# Patient Record
Sex: Female | Born: 1970 | Race: Black or African American | Hispanic: No | Marital: Single | Smoking: Never smoker
Health system: Southern US, Community
[De-identification: ages and names within clinical notes are randomized; demographics above are authoritative.]

## PROBLEM LIST (undated history)

## (undated) DIAGNOSIS — J45909 Unspecified asthma, uncomplicated: Secondary | ICD-10-CM

## (undated) DIAGNOSIS — F419 Anxiety disorder, unspecified: Secondary | ICD-10-CM

## (undated) DIAGNOSIS — R7303 Prediabetes: Secondary | ICD-10-CM

## (undated) DIAGNOSIS — I10 Essential (primary) hypertension: Secondary | ICD-10-CM

## (undated) HISTORY — PX: NO PAST SURGERIES: SHX2092

---

## 2000-01-06 ENCOUNTER — Other Ambulatory Visit: Admission: RE | Admit: 2000-01-06 | Discharge: 2000-01-06 | Payer: Self-pay | Admitting: Gynecology

## 2000-05-02 ENCOUNTER — Encounter: Payer: Self-pay | Admitting: Emergency Medicine

## 2000-05-02 ENCOUNTER — Emergency Department (HOSPITAL_COMMUNITY): Admission: EM | Admit: 2000-05-02 | Discharge: 2000-05-02 | Payer: Self-pay | Admitting: Emergency Medicine

## 2001-02-28 ENCOUNTER — Other Ambulatory Visit: Admission: RE | Admit: 2001-02-28 | Discharge: 2001-02-28 | Payer: Self-pay | Admitting: Gynecology

## 2001-09-16 ENCOUNTER — Emergency Department (HOSPITAL_COMMUNITY): Admission: EM | Admit: 2001-09-16 | Discharge: 2001-09-16 | Payer: Self-pay | Admitting: *Deleted

## 2001-09-19 ENCOUNTER — Encounter: Payer: Self-pay | Admitting: *Deleted

## 2001-09-19 ENCOUNTER — Inpatient Hospital Stay (HOSPITAL_COMMUNITY): Admission: AD | Admit: 2001-09-19 | Discharge: 2001-09-19 | Payer: Self-pay | Admitting: *Deleted

## 2002-03-02 ENCOUNTER — Other Ambulatory Visit: Admission: RE | Admit: 2002-03-02 | Discharge: 2002-03-02 | Payer: Self-pay | Admitting: Gynecology

## 2003-04-03 ENCOUNTER — Emergency Department (HOSPITAL_COMMUNITY): Admission: EM | Admit: 2003-04-03 | Discharge: 2003-04-03 | Payer: Self-pay | Admitting: Emergency Medicine

## 2003-04-03 ENCOUNTER — Other Ambulatory Visit: Admission: RE | Admit: 2003-04-03 | Discharge: 2003-04-03 | Payer: Self-pay | Admitting: Gynecology

## 2004-04-09 ENCOUNTER — Other Ambulatory Visit: Admission: RE | Admit: 2004-04-09 | Discharge: 2004-04-09 | Payer: Self-pay | Admitting: Gynecology

## 2005-01-14 ENCOUNTER — Emergency Department: Payer: Self-pay | Admitting: Emergency Medicine

## 2005-04-22 ENCOUNTER — Ambulatory Visit: Payer: Self-pay

## 2006-08-05 ENCOUNTER — Emergency Department: Payer: Self-pay | Admitting: Emergency Medicine

## 2006-12-21 ENCOUNTER — Ambulatory Visit: Payer: Self-pay

## 2007-04-01 ENCOUNTER — Ambulatory Visit: Payer: Self-pay | Admitting: Internal Medicine

## 2008-04-24 ENCOUNTER — Ambulatory Visit: Payer: Self-pay

## 2009-07-23 ENCOUNTER — Ambulatory Visit: Payer: Self-pay

## 2009-08-05 ENCOUNTER — Ambulatory Visit: Payer: Self-pay

## 2010-06-06 ENCOUNTER — Emergency Department: Payer: Self-pay | Admitting: Emergency Medicine

## 2010-08-14 ENCOUNTER — Ambulatory Visit: Payer: Self-pay

## 2011-09-24 ENCOUNTER — Ambulatory Visit: Payer: Self-pay

## 2012-03-12 ENCOUNTER — Ambulatory Visit: Payer: Self-pay | Admitting: Family Medicine

## 2012-11-14 ENCOUNTER — Ambulatory Visit: Payer: Self-pay

## 2013-12-21 ENCOUNTER — Ambulatory Visit: Payer: Self-pay

## 2014-10-11 ENCOUNTER — Encounter: Payer: Self-pay | Admitting: Emergency Medicine

## 2014-10-11 ENCOUNTER — Ambulatory Visit
Admission: EM | Admit: 2014-10-11 | Discharge: 2014-10-11 | Disposition: A | Discharge status: Home / Self Care | Payer: BC Managed Care – PPO | Attending: Family Medicine | Admitting: Family Medicine

## 2014-10-11 DIAGNOSIS — J0100 Acute maxillary sinusitis, unspecified: Secondary | ICD-10-CM | POA: Diagnosis not present

## 2014-10-11 HISTORY — DX: Unspecified asthma, uncomplicated: J45.909

## 2014-10-11 MED ORDER — AMOXICILLIN-POT CLAVULANATE 875-125 MG PO TABS: 1.0000 | ORAL_TABLET | Freq: Two times a day (BID) | ORAL | Status: DC

## 2014-10-11 NOTE — Discharge Instructions (Signed)
Take the antibiotic as prescribed.  Follow up as needed.  Please be sure to take your Advair twice daily.  Sinusitis Sinusitis is redness, soreness, and inflammation of the paranasal sinuses. Paranasal sinuses are air pockets within the bones of your face (beneath the eyes, the middle of the forehead, or above the eyes). In healthy paranasal sinuses, mucus is able to drain out, and air is able to circulate through them by way of your nose. However, when your paranasal sinuses are inflamed, mucus and air can become trapped. This can allow bacteria and other germs to grow and cause infection. Sinusitis can develop quickly and last only a short time (acute) or continue over a long period (chronic). Sinusitis that lasts for more than 12 weeks is considered chronic.  CAUSES  Causes of sinusitis include:  Allergies.  Structural abnormalities, such as displacement of the cartilage that separates your nostrils (deviated septum), which can decrease the air flow through your nose and sinuses and affect sinus drainage.  Functional abnormalities, such as when the small hairs (cilia) that line your sinuses and help remove mucus do not work properly or are not present. SIGNS AND SYMPTOMS  Symptoms of acute and chronic sinusitis are the same. The primary symptoms are pain and pressure around the affected sinuses. Other symptoms include:  Upper toothache.  Earache.  Headache.  Bad breath.  Decreased sense of smell and taste.  A cough, which worsens when you are lying flat.  Fatigue.  Fever.  Thick drainage from your nose, which often is green and may contain pus (purulent).  Swelling and warmth over the affected sinuses. DIAGNOSIS  Your health care provider will perform a physical exam. During the exam, your health care provider may:  Look in your nose for signs of abnormal growths in your nostrils (nasal polyps).  Tap over the affected sinus to check for signs of infection.  View the  inside of your sinuses (endoscopy) using an imaging device that has a light attached (endoscope). If your health care provider suspects that you have chronic sinusitis, one or more of the following tests may be recommended:  Allergy tests.  Nasal culture. A sample of mucus is taken from your nose, sent to a lab, and screened for bacteria.  Nasal cytology. A sample of mucus is taken from your nose and examined by your health care provider to determine if your sinusitis is related to an allergy. TREATMENT  Most cases of acute sinusitis are related to a viral infection and will resolve on their own within 10 days. Sometimes medicines are prescribed to help relieve symptoms (pain medicine, decongestants, nasal steroid sprays, or saline sprays).  However, for sinusitis related to a bacterial infection, your health care provider will prescribe antibiotic medicines. These are medicines that will help kill the bacteria causing the infection.  Rarely, sinusitis is caused by a fungal infection. In theses cases, your health care provider will prescribe antifungal medicine. For some cases of chronic sinusitis, surgery is needed. Generally, these are cases in which sinusitis recurs more than 3 times per year, despite other treatments. HOME CARE INSTRUCTIONS   Drink plenty of water. Water helps thin the mucus so your sinuses can drain more easily.  Use a humidifier.  Inhale steam 3 to 4 times a day (for example, sit in the bathroom with the shower running).  Apply a warm, moist washcloth to your face 3 to 4 times a day, or as directed by your health care provider.  Use saline  nasal sprays to help moisten and clean your sinuses.  Take medicines only as directed by your health care provider.  If you were prescribed either an antibiotic or antifungal medicine, finish it all even if you start to feel better. SEEK IMMEDIATE MEDICAL CARE IF:  You have increasing pain or severe headaches.  You have  nausea, vomiting, or drowsiness.  You have swelling around your face.  You have vision problems.  You have a stiff neck.  You have difficulty breathing. MAKE SURE YOU:   Understand these instructions.  Will watch your condition.  Will get help right away if you are not doing well or get worse. Document Released: 12/28/2004 Document Revised: 05/14/2013 Document Reviewed: 01/12/2011 [facility] Patient Information 2015 [place], [place]. This information is not intended to replace advice given to you by your health care provider. Make sure you discuss any questions you have with your health care provider.

## 2014-10-11 NOTE — ED Notes (Signed)
Sinus pressure x 3 days

## 2014-10-11 NOTE — ED Provider Notes (Signed)
CSN: [id number]     Arrival date & time 10/11/14  1745 History   First MD Initiated Contact with Patient 10/11/14 1750     Chief Complaint  Patient presents with  . Facial Pain   (Consider location/radiation/quality/duration/timing/severity/associated sxs/prior Treatment) HPI 44 year old female the Asthma presents with complaints of sinus pressure/pain.  Patient reports that she has been troubled by this for the past few weeks.  It worsened this week. Pain/pressure is located predominantly over the left maxillary sinus.  She denies any associated cough, fever, chills.  She has had some rhinorrhea.  Facial pain is moderate in severity.  No exacerbating or relieving factors. No interventions tried.  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    Past Medical History  Diagnosis Date  . Asthma    Past Surgical History  Procedure Laterality Date  . No past surgeries      Family History  Problem Relation Age of Onset  . Asthma Mother   . Asthma Sister   . Asthma Brother     Social History  Substance Use Topics  . Smoking status: Never Smoker   . Smokeless tobacco: None  . Alcohol Use: Yes   OB History    No data available     Review of Systems  Constitutional: Negative for fever and chills.  HENT: Positive for congestion, ear pain, rhinorrhea and sinus pressure.   Eyes: Negative.   Respiratory: Positive for chest tightness and shortness of breath.   All other systems negative.  Allergies  Review of patient's allergies indicates no known allergies.  Home Medications   Prior to Admission medications   Medication Sig Start Date End Date Taking? Authorizing Provider  amoxicillin-clavulanate (AUGMENTIN) 875-125 MG tablet Take 1 tablet by mouth 2 (two) times daily. 10/11/14   Tommie Sams, DO   Meds Ordered and Administered this Visit  Medications - No data to display  BP 130/87 mmHg  Pulse 61  Temp(Src) 98.2 F (36.8 C) (Tympanic)  Resp 20  Ht   (1.702 m)  Wt 176 lb (79.833 kg)  BMI 27.56 kg/m2  SpO2 99%  LMP 09/27/2014 No data found.  Physical Exam  Constitutional: She is oriented to person, place, and time. She appears well-developed and well-nourished. No distress.  HENT:  Head: Normocephalic and atraumatic.  Mouth/Throat: Oropharynx is clear and moist. No oropharyngeal exudate.  Normal TM's bilaterally.  Turbinates with edema.  Maxillary sinus (left) tender to palpation.  Eyes: Conjunctivae are normal. No scleral icterus.  Neck: Neck supple. No thyromegaly present.  Cardiovascular: Normal rate and regular rhythm.   No murmur heard. Pulmonary/Chest: Effort normal and breath sounds normal. No respiratory distress. She has no wheezes. She has no rales. She exhibits no tenderness.  Abdominal: Soft. She exhibits no distension. There is no tenderness. There is no rebound and no guarding.  Lymphadenopathy:    She has no cervical adenopathy.  Neurological: She is alert and oriented to person, place, and time.  Skin: Skin is warm and dry. No rash noted.  Psychiatric: She has a normal mood and affect.  Vitals reviewed.  ED Course  Procedures (including critical care time)  Labs Review Labs Reviewed - No data to display  Imaging Review No results found.  MDM   1. Acute maxillary sinusitis, recurrence not specified    44 year old female with clinical picture consistent with sinusitis. Treating with Augmentin and advised close follow up with PCP. Patient to  be discharged home in stable condition. Advised compliance with home Advair and PRN Albuterol. No evidence of current asthma exacerbation.    Tommie Sams, DO 10/11/14 1946

## 2014-11-11 ENCOUNTER — Other Ambulatory Visit: Payer: Self-pay | Admitting: Obstetrics and Gynecology

## 2014-11-11 DIAGNOSIS — Z1231 Encounter for screening mammogram for malignant neoplasm of breast: Secondary | ICD-10-CM

## 2015-01-02 ENCOUNTER — Ambulatory Visit: Payer: BC Managed Care – PPO | Attending: Obstetrics and Gynecology

## 2015-09-10 ENCOUNTER — Encounter: Payer: Self-pay | Admitting: Emergency Medicine

## 2015-09-10 ENCOUNTER — Emergency Department
Admission: EM | Admit: 2015-09-10 | Discharge: 2015-09-10 | Disposition: A | Discharge status: Home / Self Care | Payer: BC Managed Care – PPO | Attending: Emergency Medicine | Admitting: Emergency Medicine

## 2015-09-10 ENCOUNTER — Emergency Department: Payer: BC Managed Care – PPO

## 2015-09-10 DIAGNOSIS — J45909 Unspecified asthma, uncomplicated: Secondary | ICD-10-CM | POA: Diagnosis not present

## 2015-09-10 DIAGNOSIS — R42 Dizziness and giddiness: Secondary | ICD-10-CM

## 2015-09-10 DIAGNOSIS — I159 Secondary hypertension, unspecified: Secondary | ICD-10-CM | POA: Insufficient documentation

## 2015-09-10 DIAGNOSIS — R51 Headache: Secondary | ICD-10-CM | POA: Diagnosis present

## 2015-09-10 DIAGNOSIS — F419 Anxiety disorder, unspecified: Secondary | ICD-10-CM | POA: Diagnosis not present

## 2015-09-10 DIAGNOSIS — R519 Headache, unspecified: Secondary | ICD-10-CM

## 2015-09-10 DIAGNOSIS — F439 Reaction to severe stress, unspecified: Secondary | ICD-10-CM | POA: Diagnosis not present

## 2015-09-10 DIAGNOSIS — R079 Chest pain, unspecified: Secondary | ICD-10-CM

## 2015-09-10 LAB — CBC
HCT: 36.3 % (ref 35.0–47.0)
HEMOGLOBIN: 12 g/dL (ref 12.0–16.0)
MCH: 27.8 pg (ref 26.0–34.0)
MCHC: 33.2 g/dL (ref 32.0–36.0)
MCV: 83.9 fL (ref 80.0–100.0)
Platelets: 283 10*3/uL (ref 150–440)
RBC: 4.32 MIL/uL (ref 3.80–5.20)
RDW: 13.3 % (ref 11.5–14.5)
WBC: 11.8 10*3/uL — ABNORMAL HIGH (ref 3.6–11.0)

## 2015-09-10 LAB — BASIC METABOLIC PANEL
ANION GAP: 3 — AB (ref 5–15)
BUN: 11 mg/dL (ref 6–20)
CALCIUM: 8.8 mg/dL — AB (ref 8.9–10.3)
CHLORIDE: 106 mmol/L (ref 101–111)
CO2: 27 mmol/L (ref 22–32)
Creatinine, Ser: 0.68 mg/dL (ref 0.44–1.00)
GFR calc non Af Amer: >60 mL/min (ref >60)
Glucose, Bld: 92 mg/dL (ref 65–99)
Potassium: 3.6 mmol/L (ref 3.5–5.1)
Sodium: 136 mmol/L (ref 135–145)

## 2015-09-10 LAB — TROPONIN I

## 2015-09-10 MED ORDER — IBUPROFEN 800 MG PO TABS: 800.0000 mg | ORAL_TABLET | Freq: Three times a day (TID) | ORAL | 0 refills | Status: DC | PRN

## 2015-09-10 NOTE — ED Notes (Signed)
Pt reports that she went to urgent care for headaches and elevated BP - she was also experiencing chest tightness without relief from inhaler - urgent care sent her to the ed for eval - pt reports shortness of breath but respirations are even and unlabored with lungs sound clear in all lobes - skin warm/dry - color race appropriate - pt denies chest pain just states it "feels like I need my inhaler" - pt is concerned about headaches and dizziness for the last 3-4 days

## 2015-09-10 NOTE — Discharge Instructions (Signed)
Please drink plenty of fluids, eat small regular meals struck the day, and get plenty of rest. You may take Motrin for headache.  Please make an appointment with Dr. Marcello FennelHande to have your blood pressure reevaluated, for reevaluation of her other symptoms.  Return to the emergency department if he develops severe pain, chest pain, lightheadedness or fainting, or any other symptoms concerning to you.

## 2015-09-10 NOTE — ED Provider Notes (Signed)
[facility] Emergency Department Provider Note  ____________________________________________  Time seen: Approximately 8:32 PM  I have reviewed the triage vital signs and the nursing notes.   HISTORY  Chief Complaint Chest Pain    HPI Ana Atkins is a 45 y.o. female with a history of asthma presenting for headache, dizziness, chest pain, and stress.The patient reports that for the last week, she has had a frontal headache associated with some mild dizziness, "but I never thought I was, pass out." She treated her pain with sinus medication, and PMS medication. In addition, she had some mild chest "tightness," which improved when she "calm down." Her headache also improves if she is able to stay calm. She denies any fever, trauma, visual or speech changes, numbness tingling or weakness. No nausea vomiting or diarrhea. At urgent care, the patient was found to be hypertensive.   Past Medical History:  Diagnosis Date  . Asthma     There are no active problems to display for this patient.   Past Surgical History:  Procedure Laterality Date  . NO PAST SURGERIES      Current Outpatient Rx  . Order #: [id number] Class: Normal  . Order #: [id number] Class: Print    Allergies Review of patient's allergies indicates no known allergies.  Family History  Problem Relation Age of Onset  . Asthma Mother   . Asthma Sister   . Asthma Brother     Social History Social History  Substance Use Topics  . Smoking status: Never Smoker  . Smokeless tobacco: Never Used  . Alcohol use Yes    Review of Systems Constitutional: No fever/chills.Positive dizziness. No lightheadedness or syncope. Eyes: No visual changes. No blurred or double vision. ENT: No sore throat. No congestion or rhinorrhea. Cardiovascular: Positive chest pain. Denies palpitations. Respiratory: Denies shortness of breath.  No cough. Gastrointestinal: No abdominal pain.  No nausea, no  vomiting.  No diarrhea.  No constipation. Genitourinary: Negative for dysuria. Musculoskeletal: Negative for back pain. Skin: Negative for rash. Neurological: Positive for headaches. No focal numbness, tingling or weakness.  Psychiatric:Positive anxiety from situational stress.  10-point ROS otherwise negative.  ____________________________________________   PHYSICAL EXAM:  VITAL SIGNS: ED Triage Vitals [09/10/15 1652]  Enc Vitals Group     BP (!) 154/93     Pulse Rate 67     Resp 18     Temp 98.4 F (36.9 C)     Temp Source Oral     SpO2 100 %     Weight 176 lb (79.8 kg)     Height 5\' 7"  (1.702 m)     Head Circumference      Peak Flow      Pain Score 5     Pain Loc      Pain Edu?      Excl. in GC?     Constitutional: Alert and oriented. Well appearing and in no acute distress. Answers questions appropriately.Patient is comfortable appearing and smiling. Eyes: Conjunctivae are normal.  . PERRLA. No scleral icterus. Head: Atraumatic. Nose: No congestion/rhinnorhea. Mouth/Throat: Mucous membranes are moist.  Neck: No stridor.  Supple.  No JVD. No meningismus. Cardiovascular: Normal rate, regular rhythm. No murmurs, rubs or gallops.  Respiratory: Normal respiratory effort.  No accessory muscle use or retractions. Lungs CTAB.  No wheezes, rales or ronchi. Gastrointestinal: Soft, nontender and nondistended.  No guarding or rebound.  No peritoneal signs. Musculoskeletal: No LE edema. No ttp in the calves or palpable cords.  Negative Homan's sign. Neurologic:  A&Ox3.  Speech is clear.  Face and smile are symmetric.  EOMI.  PERRLA. No nystagmus horizontally or vertically. Moves all extremities well. Normal gait without ataxia. Skin:  Skin is warm, dry and intact. No rash noted. Psychiatric: Mood and affect are normal. Speech and behavior are normal.  Normal judgement.  ____________________________________________   LABS (all labs ordered are listed, but only abnormal  results are displayed)  Labs Reviewed  BASIC METABOLIC PANEL - Abnormal; Notable for the following:       Result Value   Calcium 8.8 (*)    Anion gap 3 (*)    All other components within normal limits  CBC - Abnormal; Notable for the following:    WBC 11.8 (*)    All other components within normal limits  TROPONIN I   ____________________________________________  EKG  ED ECG REPORT I, Rockne Menghini, the attending physician, personally viewed and interpreted this ECG.   Date: 09/10/2015  EKG Time: 1652  Rate: 68  Rhythm: normal sinus rhythm  Axis: Normal  Intervals:none  ST&T Change: Nonspecific T-wave inversions in V1. No ischemic changes. No evidence of hypertrophy.  ____________________________________________  RADIOLOGY  Dg Chest 2 View  Result Date: 09/10/2015 CLINICAL DATA:  Chest tightness EXAM: CHEST  2 VIEW COMPARISON:  06/06/2010 chest radiograph. FINDINGS: Stable cardiomediastinal silhouette with normal heart size. No pneumothorax. No pleural effusion. Lungs appear clear, with no acute consolidative airspace disease and no pulmonary edema. IMPRESSION: No active cardiopulmonary disease. Electronically Signed   By: Delbert Phenix M.D.   On: 09/10/2015 18:47    ____________________________________________   PROCEDURES  Procedure(s) performed: None  Procedures  Critical Care performed: No ____________________________________________   INITIAL IMPRESSION / ASSESSMENT AND PLAN / ED COURSE  Pertinent labs & imaging results that were available during my care of the patient were reviewed by me and considered in my medical decision making (see chart for details).  45 y.o. female presenting with several days of mild dizziness with headache, chest pain and situational stress. Overall, the patient is well-appearing. Her vital signs do show some hypertension and this runs in her family that she has not been diagnosed with this. I have encouraged her to follow  up with her primary care physician to have her blood pressure reevaluated. At this time her blood pressure is 157/99. It is unlikely that the patient's chest pain is related to ACS or MI, no do not see any evidence that would be suggestive of PE. The patient is no longer having any symptoms at this time, including headache. I do not think she has an intracranial mass, or meningitis. At this time, the patient is safe for discharge. I have discussed follow-up and return precautions with the patient.  ____________________________________________  FINAL CLINICAL IMPRESSION(S) / ED DIAGNOSES  Final diagnoses:  Secondary hypertension, unspecified  Acute nonintractable headache, unspecified headache type  Chest pain, unspecified chest pain type  Stress  Dizziness    Clinical Course      NEW MEDICATIONS STARTED DURING THIS VISIT:  New Prescriptions   IBUPROFEN (ADVIL,MOTRIN) 800 MG TABLET    Take 1 tablet (800 mg total) by mouth every 8 (eight) hours as needed for headache (with food).      Rockne Menghini, MD 09/10/15 2037

## 2015-09-10 NOTE — ED Triage Notes (Signed)
Upper chest wall tightness , non radiating , with lightheadedness x3 days, hx of asthma,

## 2016-12-30 ENCOUNTER — Other Ambulatory Visit: Payer: Self-pay | Admitting: Obstetrics and Gynecology

## 2016-12-30 DIAGNOSIS — Z1231 Encounter for screening mammogram for malignant neoplasm of breast: Secondary | ICD-10-CM

## 2017-01-26 ENCOUNTER — Ambulatory Visit
Admission: RE | Admit: 2017-01-26 | Discharge: 2017-01-26 | Disposition: A | Discharge status: Home / Self Care | Payer: BC Managed Care – PPO | Source: Ambulatory Visit | Attending: Obstetrics and Gynecology | Admitting: Obstetrics and Gynecology

## 2017-01-26 DIAGNOSIS — Z1231 Encounter for screening mammogram for malignant neoplasm of breast: Secondary | ICD-10-CM | POA: Insufficient documentation

## 2017-01-31 ENCOUNTER — Other Ambulatory Visit: Payer: Self-pay | Admitting: Obstetrics and Gynecology

## 2017-01-31 DIAGNOSIS — R928 Other abnormal and inconclusive findings on diagnostic imaging of breast: Secondary | ICD-10-CM

## 2017-01-31 DIAGNOSIS — N6489 Other specified disorders of breast: Secondary | ICD-10-CM

## 2017-02-09 ENCOUNTER — Ambulatory Visit
Admission: RE | Admit: 2017-02-09 | Discharge: 2017-02-09 | Disposition: A | Discharge status: Home / Self Care | Payer: BC Managed Care – PPO | Source: Ambulatory Visit | Attending: Obstetrics and Gynecology | Admitting: Obstetrics and Gynecology

## 2017-02-09 DIAGNOSIS — N6489 Other specified disorders of breast: Secondary | ICD-10-CM

## 2017-02-09 DIAGNOSIS — R928 Other abnormal and inconclusive findings on diagnostic imaging of breast: Secondary | ICD-10-CM

## 2017-04-07 ENCOUNTER — Emergency Department: Payer: BC Managed Care – PPO

## 2017-04-07 ENCOUNTER — Emergency Department
Admission: EM | Admit: 2017-04-07 | Discharge: 2017-04-08 | Disposition: A | Discharge status: Home / Self Care | Payer: BC Managed Care – PPO | Attending: Emergency Medicine | Admitting: Emergency Medicine

## 2017-04-07 ENCOUNTER — Other Ambulatory Visit: Payer: Self-pay

## 2017-04-07 DIAGNOSIS — J45909 Unspecified asthma, uncomplicated: Secondary | ICD-10-CM | POA: Diagnosis not present

## 2017-04-07 DIAGNOSIS — F329 Major depressive disorder, single episode, unspecified: Secondary | ICD-10-CM

## 2017-04-07 DIAGNOSIS — F4323 Adjustment disorder with mixed anxiety and depressed mood: Secondary | ICD-10-CM | POA: Diagnosis present

## 2017-04-07 DIAGNOSIS — R0789 Other chest pain: Secondary | ICD-10-CM | POA: Insufficient documentation

## 2017-04-07 DIAGNOSIS — F4321 Adjustment disorder with depressed mood: Secondary | ICD-10-CM

## 2017-04-07 DIAGNOSIS — F32A Depression, unspecified: Secondary | ICD-10-CM

## 2017-04-07 DIAGNOSIS — F430 Acute stress reaction: Secondary | ICD-10-CM | POA: Diagnosis present

## 2017-04-07 LAB — BASIC METABOLIC PANEL
Anion gap: 9 (ref 5–15)
BUN: 8 mg/dL (ref 6–20)
CO2: 23 mmol/L (ref 22–32)
Calcium: 8.9 mg/dL (ref 8.9–10.3)
Chloride: 105 mmol/L (ref 101–111)
Creatinine, Ser: 0.77 mg/dL (ref 0.44–1.00)
GFR calc Af Amer: >60 mL/min (ref >60)
Glucose, Bld: 101 mg/dL — ABNORMAL HIGH (ref 65–99)
POTASSIUM: 3.6 mmol/L (ref 3.5–5.1)
Sodium: 137 mmol/L (ref 135–145)

## 2017-04-07 LAB — URINE DRUG SCREEN, QUALITATIVE (ARMC ONLY)
Amphetamines, Ur Screen: NOT DETECTED
BARBITURATES, UR SCREEN: NOT DETECTED
Benzodiazepine, Ur Scrn: NOT DETECTED
CANNABINOID 50 NG, UR ~~LOC~~: NOT DETECTED
COCAINE METABOLITE, UR ~~LOC~~: NOT DETECTED
MDMA (ECSTASY) UR SCREEN: NOT DETECTED
Methadone Scn, Ur: NOT DETECTED
OPIATE, UR SCREEN: NOT DETECTED
PHENCYCLIDINE (PCP) UR S: NOT DETECTED
Tricyclic, Ur Screen: NOT DETECTED

## 2017-04-07 LAB — CBC
HEMATOCRIT: 39 % (ref 35.0–47.0)
Hemoglobin: 12.6 g/dL (ref 12.0–16.0)
MCH: 27.8 pg (ref 26.0–34.0)
MCHC: 32.4 g/dL (ref 32.0–36.0)
MCV: 85.9 fL (ref 80.0–100.0)
Platelets: 343 10*3/uL (ref 150–440)
RBC: 4.53 MIL/uL (ref 3.80–5.20)
RDW: 12.7 % (ref 11.5–14.5)
WBC: 10.5 10*3/uL (ref 3.6–11.0)

## 2017-04-07 LAB — POCT PREGNANCY, URINE: Preg Test, Ur: NEGATIVE

## 2017-04-07 LAB — ETHANOL: Alcohol, Ethyl (B): <10 mg/dL (ref <10)

## 2017-04-07 LAB — TROPONIN I: Troponin I: <0.03 ng/mL (ref <0.03)

## 2017-04-07 LAB — ACETAMINOPHEN LEVEL: Acetaminophen (Tylenol), Serum: <10 ug/mL — ABNORMAL LOW (ref 10–30)

## 2017-04-07 LAB — SALICYLATE LEVEL: Salicylate Lvl: <7 mg/dL (ref 2.8–30.0)

## 2017-04-07 MED ORDER — IBUPROFEN 600 MG PO TABS
600.0000 mg | ORAL_TABLET | Freq: Four times a day (QID) | ORAL | Status: DC | PRN
  Administered 2017-04-07: 600 mg via ORAL
  Filled 2017-04-07: qty 1

## 2017-04-07 MED ORDER — DIPHENHYDRAMINE HCL 25 MG PO CAPS: 50.0000 mg | ORAL_CAPSULE | Freq: Every evening | ORAL | Status: DC | PRN

## 2017-04-07 NOTE — ED Triage Notes (Signed)
FIRST NURSE NOTE-here for chest tightness.  Pulled next for EKG. Does not appear in distress.

## 2017-04-07 NOTE — ED Notes (Signed)
Hourly rounding reveals patient in room. No complaints, stable, in no acute distress. Q15 minute rounds and monitoring via Security Cameras to continue. 

## 2017-04-07 NOTE — ED Notes (Signed)
[facility]

## 2017-04-07 NOTE — ED Notes (Signed)
Sister Zella BallRobin  [phone] Mother  Hilda LiasMarie  [phone]

## 2017-04-07 NOTE — ED Notes (Signed)
Pt. Transferred to [facility] from ED to room 5 after screening for contraband. Report to include Situation, Background, Assessment and Recommendations from Kim RN. Pt. Oriented to unit including Q15 minute rounds as well as the security cameras for their protection. Patient is alert and oriented, warm and dry in no acute distress. Patient denies SI, HI, and AVH. Pt. Encouraged to let me know if needs arise. 

## 2017-04-07 NOTE — ED Provider Notes (Signed)
[facility] Emergency Department Provider Note   ____________________________________________   I have reviewed the triage vital signs and the nursing notes.   HISTORY  Chief Complaint Stress  History limited by: Not Limited   HPI Ana Atkins is a 47 y.o. female who presents to the emergency department today with primary concern for stress. Patient states things have been bad at her work for quite some time. Today however things became worse as she was accused of bulling and was suspended. She feels like it would be better if she could just be invisible and disappear. She denies any thoughts of wanting to harm herself. In addition to her stress and depression she also has complaints of central chest pain. It is a tight feeling. Has been present for weeks and it does come and go. She denies any associated shortness of breath or fevers.   Per medical record review patient has a history of asthma.   Past Medical History:  Diagnosis Date  . Asthma     There are no active problems to display for this patient.   Past Surgical History:  Procedure Laterality Date  . NO PAST SURGERIES      Prior to Admission medications   Medication Sig Start Date End Date Taking? Authorizing Provider  amoxicillin-clavulanate (AUGMENTIN) 875-125 MG tablet Take 1 tablet by mouth 2 (two) times daily. 10/11/14   Tommie Sams, DO  ibuprofen (ADVIL,MOTRIN) 800 MG tablet Take 1 tablet (800 mg total) by mouth every 8 (eight) hours as needed for headache (with food). 09/10/15   Rockne Menghini, MD    Allergies Patient has no known allergies.  Family History  Problem Relation Age of Onset  . Asthma Mother   . Asthma Sister   . Asthma Brother   . Breast cancer Maternal Aunt 74       and 46 metastatic     Social History Social History   Tobacco Use  . Smoking status: Never Smoker  . Smokeless tobacco: Never Used  Substance Use Topics  . Alcohol use: Yes  .  Drug use: Never    Review of Systems Constitutional: No fever/chills Eyes: No visual changes. ENT: No sore throat. Cardiovascular: Positive for chest pain. Respiratory: Denies shortness of breath. Gastrointestinal: No abdominal pain.  No nausea, no vomiting.  No diarrhea.   Genitourinary: Negative for dysuria. Musculoskeletal: Negative for back pain. Skin: Negative for rash. Neurological: Negative for headaches, focal weakness or numbness.  ____________________________________________   PHYSICAL EXAM:  VITAL SIGNS: ED Triage Vitals [04/07/17 1743]  Enc Vitals Group     BP (!) 167/119     Pulse Rate (!) 108     Resp 16     Temp 98 F (36.7 C)     Temp Source Oral     SpO2 97 %     Weight 171 lb (77.6 kg)     Height 5\' 6"  (1.676 m)     Head Circumference      Peak Flow      Pain Score 7   Constitutional: Alert and oriented. Tearful. Eyes: Conjunctivae are normal.  ENT   Head: Normocephalic and atraumatic.   Nose: No congestion/rhinnorhea.   Mouth/Throat: Mucous membranes are moist.   Neck: No stridor. Hematological/Lymphatic/Immunilogical: No cervical lymphadenopathy. Cardiovascular: Normal rate, regular rhythm.  No murmurs, rubs, or gallops.  Respiratory: Normal respiratory effort without tachypnea nor retractions. Breath sounds are clear and equal bilaterally. No wheezes/rales/rhonchi. Gastrointestinal: Soft and non tender. No  rebound. No guarding.  Genitourinary: Deferred Musculoskeletal: Normal range of motion in all extremities. No lower extremity edema. Neurologic:  Normal speech and language. No gross focal neurologic deficits are appreciated.  Skin:  Skin is warm, dry and intact. No rash noted. Psychiatric: Tearful. Depressed.   ____________________________________________    LABS (pertinent positives/negatives)  Trop <0.03 Acetaminophen, salicylate, ethanol negative CBC wnl BMP wnl except glu 101 Upreg negative UDS  negative  ____________________________________________   EKG  I, Phineas SemenGraydon Mahealani Sulak, attending physician, personally viewed and interpreted this EKG  EKG Time: 1736 Rate: 106 Rhythm: sinus tachycardia Axis: left axis deviation Intervals: qtc 462 QRS: narrow ST changes: no st elevation Impression: abnormal ekg   ____________________________________________    RADIOLOGY   CXR No acute disease   ____________________________________________   PROCEDURES  Procedures  ____________________________________________   INITIAL IMPRESSION / ASSESSMENT AND PLAN / ED COURSE  Pertinent labs & imaging results that were available during my care of the patient were reviewed by me and considered in my medical decision making (see chart for details).  Patient presented to the emergency department today with primary complaint of stress and some depression.  On exam patient was tearful.  Will have patient evaluated by psychiatry.  In addition patient was complaining of chest pain.  I think this most likely related to stress however would also consider pneumonia, pneumothorax, ACS, gastritis amongst other etiologies.  Chest x-ray and workup however without any concerning findings.  Awaiting psychiatric evaluation at time of signout.  ____________________________________________   FINAL CLINICAL IMPRESSION(S) / ED DIAGNOSES  Final diagnoses:  Depression, unspecified depression type  Atypical chest pain     Note: This dictation was prepared with Dragon dictation. Any transcriptional errors that result from this process are unintentional     Phineas SemenGoodman, Maleny Candy, MD 04/07/17 2325

## 2017-04-07 NOTE — ED Notes (Signed)
Hourly rounding reveals patient sleeping in room. No complaints, stable, in no acute distress. Q15 minute rounds and monitoring via Security Cameras to continue. 

## 2017-04-07 NOTE — ED Triage Notes (Signed)
Pt c/o chest pain that started one month ago - she c/o dizziness and lightheadedness - she states that she has been under a lot of stress at work and feels like she is having a "nervous breakdown" - pain can be reproduced with movement

## 2017-04-07 NOTE — ED Notes (Addendum)
Pt sister Lu DuffelRobin Paulette took all of pt belongings with her

## 2017-04-07 NOTE — ED Notes (Signed)

## 2017-04-08 ENCOUNTER — Encounter: Payer: Self-pay | Admitting: Psychiatry

## 2017-04-08 DIAGNOSIS — F4323 Adjustment disorder with mixed anxiety and depressed mood: Secondary | ICD-10-CM | POA: Diagnosis present

## 2017-04-08 MED ORDER — TRAZODONE HCL 50 MG PO TABS: 50.0000 mg | ORAL_TABLET | Freq: Every evening | ORAL | Status: DC | PRN

## 2017-04-08 NOTE — ED Notes (Signed)
Patient on telephone with mother. 

## 2017-04-08 NOTE — ED Provider Notes (Signed)
-----------------------------------------   1:26 AM on 04/08/2017 -----------------------------------------  [facility] Garnetta BuddyFaheem who recommends inpatient psychiatry admission.  Recommends trazodone 50 mg nightly as needed for insomnia.   Irean HongSung, Jade J, MD 04/08/17 [phone]

## 2017-04-08 NOTE — Consult Note (Signed)
  Assessed Ana Atkins who is here for severe work-related anxiety.  Patient does not meet criteria for admission. She is no suicidal or homicidal.  Case discussed with Dr. Shaune PollackLord. Full consult to follow.  PLAN: 1.Please discharge as appropriate.  2. Dr. Marcello FennelHande, her PCP, offered a note for work to allow her to recuperate in the next two weeks.  3. She will continue BuSpar and low dose Xanax as prescribed by Dr. Marcello FennelHande.  4. I [name]:00 am.
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
## 2017-04-08 NOTE — ED Notes (Signed)
Hourly rounding reveals patient sleeping in room. No complaints, stable, in no acute distress. Q15 minute rounds and monitoring via Security Cameras to continue. 

## 2017-04-08 NOTE — ED Provider Notes (Signed)
Discussed with Dr. Jennet MaduroPucilowska, in person psychiatrist saw patient and recommends discharge home.     Governor RooksLord, Arieona Swaggerty, MD 04/08/17 1213

## 2017-04-08 NOTE — Progress Notes (Signed)
Patient was provided with appointment for [facility] on 04/11/2017 at 8 am in the morning with Joni ReiningNicole ( Therapist).   Patient has agreed with this follow up discharge.  No further needs  Ladd Cen LCSW
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
## 2017-04-08 NOTE — Discharge Instructions (Addendum)
Return to the emergency department immediately for any worsening condition including worsening depression, any thoughts of wanting to hurt yourself or others.

## 2017-04-11 ENCOUNTER — Ambulatory Visit: Payer: BC Managed Care – PPO | Admitting: Licensed Clinical Social Worker

## 2017-04-11 DIAGNOSIS — F4323 Adjustment disorder with mixed anxiety and depressed mood: Secondary | ICD-10-CM
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
## 2017-05-05 ENCOUNTER — Ambulatory Visit: Payer: BC Managed Care – PPO | Admitting: Licensed Clinical Social Worker

## 2017-05-05 ENCOUNTER — Encounter

## 2017-05-05 DIAGNOSIS — F4323 Adjustment disorder with mixed anxiety and depressed mood: Secondary | ICD-10-CM | POA: Diagnosis not present

## 2017-05-17 NOTE — Progress Notes (Signed)
   THERAPIST PROGRESS NOTE  Session Time: 60min  Participation Level: Active  Behavioral Response: Neat and Well GroomedAlertEuthymic  Type of Therapy: Individual Therapy  Treatment Goals addressed: Anxiety and Coping  Interventions: CBT and Motivational Interviewing  Summary: Ana Atkins is a 47 y.o. female who presents with a reduction in her diagnosis. Therapist met with Patient in an initial therapy session to assess current mood and to build rapport. Therapist engaged Patient in discussion about her life and what is going well for her. Therapist provided support for Patient as she shared details about her life, her current stressors, mood, coping skills, her past, and her children. Therapist prompted Patient to discuss her support system and ways that she manages her daily stress, anger, and frustrations. LCSW discussed what psychotherapy is and is not and the importance of the therapeutic relationship to include open and honest communication between client and therapist and building trust.  Reviewed advantages and disadvantages of the therapeutic process and limitations to the therapeutic relationship including LCSW's role in maintaining the safety of the client, others and those in client's care. Engaged Patient in discussion about her current mood and symptoms that she is current experiencing.  Facilitated a discussion on her schedule and how he was able to manage.  Discussed her progress and regression. Assisted Patient with verbally listing things that are going well.  Shifted focus of discussion on medication management.      Suicidal/Homicidal: No  Plan: Return again in 2 weeks.  Diagnosis: Axis I: Adjustment Disorder with Mixed Emotional Features    Axis II: No diagnosis    Nicole M Peacock, LCSW 05/05/2017  

## 2018-02-16 ENCOUNTER — Other Ambulatory Visit: Payer: Self-pay | Admitting: Internal Medicine

## 2018-02-16 DIAGNOSIS — Z1231 Encounter for screening mammogram for malignant neoplasm of breast: Secondary | ICD-10-CM

## 2018-03-06 ENCOUNTER — Inpatient Hospital Stay: Admission: RE | Admit: 2018-03-06 | Payer: BC Managed Care – PPO | Source: Ambulatory Visit

## 2018-11-22 ENCOUNTER — Other Ambulatory Visit: Payer: Self-pay

## 2018-11-22 ENCOUNTER — Ambulatory Visit
Admission: EM | Admit: 2018-11-22 | Discharge: 2018-11-22 | Disposition: A | Discharge status: Home / Self Care | Payer: BC Managed Care – PPO | Attending: Family Medicine | Admitting: Family Medicine

## 2018-11-22 DIAGNOSIS — R0981 Nasal congestion: Secondary | ICD-10-CM

## 2018-11-22 DIAGNOSIS — J3489 Other specified disorders of nose and nasal sinuses: Secondary | ICD-10-CM

## 2018-11-22 DIAGNOSIS — J069 Acute upper respiratory infection, unspecified: Secondary | ICD-10-CM

## 2018-11-22 DIAGNOSIS — Z7189 Other specified counseling: Secondary | ICD-10-CM

## 2018-11-22 MED ORDER — KETOROLAC TROMETHAMINE 60 MG/2ML IM SOLN
60.0000 mg | Freq: Once | INTRAMUSCULAR | Status: AC
  Administered 2018-11-22: 60 mg via INTRAMUSCULAR

## 2018-11-22 NOTE — ED Provider Notes (Signed)
[facility] ____________________________________________  Time seen: Approximately 4:18 PM  I have reviewed the triage vital signs and the nursing notes.   HISTORY  Chief Complaint Sinus Problem   HPI Ana Atkins is a 48 y.o. female presenting for evaluation of nasal congestion, postnasal drainage and sinus pressure with some left ear discomfort present since yesterday and worsening today.  Has tried multiple over-the-counter allergy and decongestants today without resolution.  States sinus pressure causes a headache.  Denies fevers, cough, sore throat, chest pain, shortness of breath, changes in taste or smell, vomiting or diarrhea.  States her son is being tested for COVID-19.  Denies other known sick contacts.  Does work in a school.  Continues eat and drink well.  States she does often have allergy issues with some similar presentation.  Denies other aggravating alleviating factors.  Patient's last menstrual period was 10/26/2018.  Reports perimenopausal.  Denies pregnancy.  Barbette Reichmann, MD : PCP   Past Medical History:  Diagnosis Date  . Asthma     Patient Active Problem List   Diagnosis Date Noted  . Adjustment disorder with mixed anxiety and depressed mood 04/08/2017    Past Surgical History:  Procedure Laterality Date  . NO PAST SURGERIES       No current facility-administered medications for this encounter.   Current Outpatient Medications:  .  busPIRone (BUSPAR) 7.5 MG tablet, Take 7.5 mg by mouth 2 (two) times daily., Disp: , Rfl: 3 .  cyclobenzaprine (FLEXERIL) 5 MG tablet, Take 5 mg by mouth 2 (two) times daily as needed., Disp: , Rfl: 3 .  FLUoxetine (PROZAC) 40 MG capsule, Take 40 mg by mouth daily., Disp: , Rfl: 0 .  losartan (COZAAR) 50 MG tablet, Take 50 mg by mouth daily., Disp: , Rfl: 0 .  SYMBICORT 160-4.5 MCG/ACT inhaler, Inhale 2 puffs into the lungs 2 (two) times daily., Disp: , Rfl: 0  Allergies Patient has no known  allergies.  Family History  Problem Relation Age of Onset  . Asthma Mother   . Hypertension Mother   . Asthma Sister   . Asthma Brother   . Breast cancer Maternal Aunt 39       and 67 metastatic   . Healthy Father     Social History Social History   Tobacco Use  . Smoking status: Never Smoker  . Smokeless tobacco: Never Used  Substance Use Topics  . Alcohol use: Yes  . Drug use: Never    Review of Systems Constitutional: No fever/chills Eyes: No visual changes. ENT: No sore throat.  As above. Cardiovascular: Denies chest pain. Respiratory: Denies shortness of breath. Gastrointestinal: No abdominal pain.  Musculoskeletal: Negative for back pain. Skin: Negative for rash. Neurological: Negative for focal weakness or numbness.    ____________________________________________   PHYSICAL EXAM:  VITAL SIGNS: ED Triage Vitals  Enc Vitals Group     BP 11/22/18 1612 (!) 126/98     Pulse Rate 11/22/18 1612 77     Resp 11/22/18 1612 19     Temp 11/22/18 1612 99.1 F (37.3 C)     Temp Source 11/22/18 1612 Oral     SpO2 11/22/18 1612 100 %     Weight 11/22/18 1609 181 lb (82.1 kg)     Height --      Head Circumference --      Peak Flow --      Pain Score 11/22/18 1609 5     Pain Loc --  Pain Edu? --      Excl. in [place]? --    Constitutional: Alert and oriented. Well appearing and in no acute distress. Eyes: Conjunctivae are normal.  Head: Atraumatic.Mild to moderate tenderness to palpation bilateral maxillary sinuses.  No frontal sinus tenderness palpation.  No swelling. No erythema.   Ears: no erythema, normal TMs bilaterally.   Nose: nasal congestion with bilateral nasal turbinate erythema and edema.   Mouth/Throat: Mucous membranes are moist. Oropharynx non-erythematous. No tonsillar swelling or exudate.  Neck: No stridor.  No cervical spine tenderness to palpation. Hematological/Lymphatic/Immunilogical: No cervical lymphadenopathy. Cardiovascular: Normal  rate, regular rhythm. Grossly normal heart sounds.  Good peripheral circulation. Respiratory: Normal respiratory effort.  No retractions. No wheezes, rales or rhonchi. Good air movement.  Musculoskeletal: Steady gait.  Neurologic:  Normal speech and language. No gait instability. Skin:  Skin is warm, dry and intact. No rash noted. Psychiatric: Mood and affect are normal. Speech and behavior are normal.  ___________________________________________   LABS (all labs ordered are listed, but only abnormal results are displayed)  Labs Reviewed  NOVEL CORONAVIRUS, NAA (HOSP ORDER, SEND-OUT TO REF LAB; TAT 18-24 HRS)   ____________________________________________   PROCEDURES Procedures   INITIAL IMPRESSION / ASSESSMENT AND PLAN / ED COURSE  Pertinent labs & imaging results that were available during my care of the patient were reviewed by me and considered in my medical decision making (see chart for details).  Well-appearing patient.  No acute distress.  Suspect viral upper respiratory infection with viral sinusitis.  COVID-19 testing completed and advice given.  Encourage rest, fluids, supportive care, continue over-the-counter medications as needed.  60 mg IM Toradol given once in urgent care.Discussed indication, risks and benefits of medications with patient.   Discussed follow up with Primary care physician this week as needed. Discussed follow up and return parameters including no resolution or any worsening concerns. Patient verbalized understanding and agreed to plan.   ____________________________________________   FINAL CLINICAL IMPRESSION(S) / ED DIAGNOSES  Final diagnoses:  Acute upper respiratory infection  Advice given about COVID-19 virus infection     ED Discharge Orders    None       Note: This dictation was prepared with Dragon dictation along with smaller phrase technology. Any transcriptional errors that result from this process are unintentional.          Marylene Land, NP 11/22/18 1654

## 2018-11-22 NOTE — ED Triage Notes (Signed)
Pt. States since yesterday she has had ear pain/pressure, headache, nasal drainage.

## 2018-11-22 NOTE — Discharge Instructions (Signed)
Over the counter medication. Rest. Drink plenty of fluids.  ° °Follow up with your primary care physician this week as needed. Return to Urgent care for new or worsening concerns.  ° °

## 2018-11-23 LAB — NOVEL CORONAVIRUS, NAA (HOSP ORDER, SEND-OUT TO REF LAB; TAT 18-24 HRS): SARS-CoV-2, NAA: NOT DETECTED

## 2019-02-14 ENCOUNTER — Other Ambulatory Visit: Payer: Self-pay | Admitting: Internal Medicine

## 2019-02-14 DIAGNOSIS — Z1231 Encounter for screening mammogram for malignant neoplasm of breast: Secondary | ICD-10-CM

## 2020-12-11 HISTORY — PX: OTHER SURGICAL HISTORY: SHX169

## 2021-03-04 ENCOUNTER — Other Ambulatory Visit: Payer: Self-pay | Admitting: Internal Medicine

## 2021-03-04 DIAGNOSIS — Z1231 Encounter for screening mammogram for malignant neoplasm of breast: Secondary | ICD-10-CM

## 2021-03-26 ENCOUNTER — Ambulatory Visit: Payer: BC Managed Care – PPO

## 2021-05-06 ENCOUNTER — Ambulatory Visit
Admission: RE | Admit: 2021-05-06 | Discharge: 2021-05-06 | Disposition: A | Discharge status: Home / Self Care | Payer: BC Managed Care – PPO | Source: Ambulatory Visit | Attending: Internal Medicine | Admitting: Internal Medicine

## 2021-05-06 DIAGNOSIS — Z1231 Encounter for screening mammogram for malignant neoplasm of breast: Secondary | ICD-10-CM | POA: Insufficient documentation

## 2021-05-11 ENCOUNTER — Other Ambulatory Visit: Payer: Self-pay | Admitting: *Deleted

## 2021-05-11 ENCOUNTER — Inpatient Hospital Stay
Admission: RE | Admit: 2021-05-11 | Discharge: 2021-05-11 | Disposition: A | Discharge status: Home / Self Care | Payer: Self-pay | Source: Ambulatory Visit | Attending: *Deleted | Admitting: *Deleted

## 2021-05-11 DIAGNOSIS — Z1231 Encounter for screening mammogram for malignant neoplasm of breast: Secondary | ICD-10-CM

## 2021-05-26 ENCOUNTER — Other Ambulatory Visit: Payer: Self-pay | Admitting: Obstetrics and Gynecology

## 2021-06-11 NOTE — H&P (Signed)
[name] is a 51 y.o. female here for Pre Op Consulting (Preop/sign consents) . History of Present Illness: Patient presents for a preoperative visit. She was last seen 05/12/21 for PMB ongoing x 3 months. She had a cervical mass vs polyp with vascular  noted on TVUS. Plan for removal under anesthesia with D&C hysteroscopy, myosure polypectomy.    Today: no concerns, consents signed   TVUS 05/12/21 UT Anteverted Endo = 4.1 mm Myomas seen = 1. Post: 1.2 x 0.7 x 1.1 cm, 2. LT: 1.7 x 1.1 x 1.7 cm, 3. Post: 0.8 x 0.8 x 1.3 cm CX= solid appearing structure w/ single vessel = 2.1 x 0.4 x 1.2 cm RO WNL LO w/ cystic structure = 2.5 x 2.0 x 2.4 cm No FF in PCDS.            Pertinent Hx: - SVD x 2, 1 preterm delivery    - HTN  - Maternal Aunt with hx of breast cancer    Past Medical History:  has a past medical history of Anemia, Anxiety, Asthma without status asthmaticus, Chronic neck pain (03/2010), Essential hypertension, benign (03/31/2017), GERD (gastroesophageal reflux disease), and Thyroid nodule (08/15/2018).  Past Surgical History:  has no past surgical history on file. Family History: family history includes Breast cancer in her maternal aunt; Cataracts in her mother; Colon polyps (age of onset: 58) in her brother; High blood pressure (Hypertension) in her maternal uncle and mother; No Known Problems in her father; Stroke in her mother; Thyroid nodules in her mother. Social History:  reports that she has never smoked. She has never used smokeless tobacco. She reports current alcohol use of about 5.0 standard drinks per week. She reports that she does not use drugs. OB/GYN History:  OB History     Gravida 2   Para 2   Term 1   Preterm 1   AB     Living 1     SAB     IAB     Ectopic     Molar     Multiple     Live Births 1          Allergies: has No Known Allergies. Medications:   Current Outpatient Medications:    albuterol 90  mcg/actuation inhaler, INHALE 2 INHALATIONS INTO THE LUNGS EVERY 6 (SIX) HOURS AS NEEDED, Disp: 8.5 each, Rfl: 5   ALPRAZolam (XANAX) 0.25 MG tablet, Take 1 tablet (0.25 mg total) by mouth 2 (two) times daily as needed for Sleep, Disp: 30 tablet, Rfl: 2   amLODIPine (NORVASC) 2.5 MG tablet, Take 1 tablet (2.5 mg total) by mouth once daily, Disp: 90 tablet, Rfl: 1   ascorbic acid (VITAMIN C) 500 MG tablet, Take 500 mg by mouth once daily., Disp: , Rfl:    azelastine (ASTELIN) 137 mcg nasal spray, Place 1 spray into both nostrils 2 (two) times daily, Disp: 30 mL, Rfl: 1   BIOTIN ORAL, Take 1 tablet by mouth once daily, Disp: , Rfl:    busPIRone (BUSPAR) 10 MG tablet, TAKE 1 TABLET BY MOUTH TWICE A DAY, Disp: 180 tablet, Rfl: 1   famotidine (PEPCID) 10 MG tablet, Take 10 mg by mouth 2 (two) times daily, Disp: , Rfl:    ferrous sulfate 325 (65 FE) MG tablet, Take 325 mg by mouth daily with breakfast., Disp: , Rfl:    FLUoxetine (PROZAC) 20 MG capsule, TAKE 3 CAPSULES BY MOUTH EVERY DAY, Disp: 90 capsule, Rfl: 1   losartan (  COZAAR) 100 MG tablet, TAKE 1 TABLET (100 MG TOTAL) BY MOUTH ONCE DAILY, Disp: 90 tablet, Rfl: 1   meloxicam (MOBIC) 15 MG tablet, TAKE 1 TABLET BY MOUTH EVERY DAY, Disp: 30 tablet, Rfl: 3   multivitamin tablet, Take 1 tablet by mouth once daily., Disp: , Rfl:    omeprazole magnesium (PRILOSEC OTC ORAL), Take by mouth One daily, Disp: , Rfl:    VITAMIN B COMPLEX (B COMPLEX VITAMINS ORAL), Take by mouth once daily.  , Disp: , Rfl:    peg-electrolyte (NULYTELY) solution, Take 4,000 mLs by mouth as directed Split Colon Prep. (Patient not taking: Reported on 05/11/2021), Disp: 4000 mL, Rfl: 0   tiZANidine (ZANAFLEX) 2 MG tablet, TAKE 1 TABLET BY MOUTH 3 TIMES DAILY AS NEEDED. (Patient not taking: Reported on 05/11/2021), Disp: 270 tablet, Rfl: 1   Review of Systems: No SOB, no palpitations or chest pain, no new lower extremity edema, no nausea or vomiting or bowel or bladder complaints. See  HPI for gyn specific ROS.    Exam:   BP (!) 146/97   Pulse 77   Ht 167.6 cm (5\' 6" )   Wt 80.6 kg (177 lb 12.8 oz)   LMP  (LMP Unknown)   BMI 28.70 kg/m    General: Patient is well-groomed, well-nourished, appears stated age in no acute distress   HEENT: head is atraumatic and normocephalic, trachea is midline, neck is supple with no palpable nodules   CV: Regular rhythm and normal heart rate, no murmur   Pulm: Clear to auscultation throughout lung fields with no wheezing, crackles, or rhonchi. 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 A diagnosis of Postmenopausal bleeding was also pertinent to this visit.   Plan:   1.  Preoperative visit: D&C hysteroscopy, possible myosure polypectomy with endoloop. Consents signed today.  -Risks of surgery were discussed with the patient including but not limited to: bleeding which may require transfusion; infection which may require antibiotics; injury to uterus or surrounding organs; intrauterine scarring which may impair future fertility; need for additional procedures including laparotomy or laparoscopy; and other postoperative/anesthesia complications. Written informed consent was obtained.   This is a scheduled same-day surgery. She will have a postop visit in 2 weeks to review operative findings and pathology.   Return for Postop check.

## 2021-06-17 ENCOUNTER — Encounter
Admission: RE | Admit: 2021-06-17 | Discharge: 2021-06-17 | Disposition: A | Discharge status: Home / Self Care | Payer: BC Managed Care – PPO | Source: Ambulatory Visit | Attending: Obstetrics and Gynecology | Admitting: Obstetrics and Gynecology

## 2021-06-17 VITALS — Ht 66.5 in | Wt 175.0 lb

## 2021-06-17 DIAGNOSIS — I10 Essential (primary) hypertension: Secondary | ICD-10-CM

## 2021-06-17 DIAGNOSIS — Z01812 Encounter for preprocedural laboratory examination: Secondary | ICD-10-CM

## 2021-06-17 HISTORY — DX: Anxiety disorder, unspecified: F41.9

## 2021-06-17 HISTORY — DX: Prediabetes: R73.03

## 2021-06-17 HISTORY — DX: Essential (primary) hypertension: I10

## 2021-06-17 NOTE — Patient Instructions (Addendum)
Your procedure is scheduled on: Friday June 26, 2021. Report to Day Surgery inside [facility] 2nd floor, stop by admissions desk before getting on elevator.  To find out your arrival time please call [phone] between 1PM - 3PM on Thursday June 25, 2021.  Remember: Instructions that are not followed completely may result in serious medical risk,  up to and including death, or upon the discretion of your surgeon and anesthesiologist your  surgery may need to be rescheduled.     _X__ 1. Do not eat food after midnight the night before your procedure.                 No chewing gum or hard candies. You may drink clear liquids up to 2 hours                 before you are scheduled to arrive for your surgery- DO not drink clear                 liquids within 2 hours of the start of your surgery.                 Clear Liquids include:  water, apple juice without pulp, clear Gatorade, G2 or                  Gatorade Zero (avoid Red/Purple/Blue), Black Coffee or Tea (Do not add                 anything to coffee or tea).  __X__2.  On the morning of surgery brush your teeth with toothpaste and water, you                may rinse your mouth with mouthwash if you wish.  Do not swallow any toothpaste or mouthwash.     _X__ 3.  No Alcohol for 24 hours before or after surgery.   _X__ 4.  Do Not Smoke or use e-cigarettes For 24 Hours Prior to Your Surgery.                 Do not use any chewable tobacco products for at least 6 hours prior to                 Surgery.  _X__  5.  Do not use any recreational drugs (marijuana, cocaine, heroin, ecstasy, MDMA or other)                For at least one week prior to your surgery.  Combination of these drugs with anesthesia                May have life threatening results.  ____  6.  Bring all medications with you on the day of surgery if instructed.   __X__  7.  Notify your doctor if there is any change in your medical condition       (cold, fever, infections).     Do not wear jewelry, make-up, hairpins, clips or nail polish. Do not wear lotions, powders, or perfumes. You may wear deodorant. Do not shave 48 hours prior to surgery.  Do not bring valuables to the hospital.    [facility] is not responsible for any belongings or valuables.  Contacts, dentures or bridgework may not be worn into surgery. Leave your suitcase in the car. After surgery it may be brought to your room. For patients admitted to the hospital, discharge time is determined by your treatment team.  Patients discharged the day of surgery will not be allowed to drive home.   Make arrangements for someone to be with you for the first 24 hours of your Same Day Discharge.   __X__ Take these medicines the morning of surgery with A SIP OF WATER:    1. busPIRone (BUSPAR) 7.5 MG   2. FLUoxetine (PROZAC) 40 MG  3. famotidine (PEPCID) 10 MG   4.  5.  6.  ____ Fleet Enema (as directed)   __X__ Use CHG Soap (or wipes) as directed  ____ Use Benzoyl Peroxide Gel as instructed  __X__ Use inhalers on the day of surgery  albuterol (VENTOLIN HFA) 108 (90 Base) MCG/ACT inhaler  ____ Stop metformin 2 days prior to surgery    ____ Take 1/2 of usual insulin dose the night before surgery. No insulin the morning          of surgery.   ____ Call your PCP, cardiologist, or Pulmonologist if taking Coumadin/Plavix/aspirin and ask when to stop before your surgery.   __X__ One Week prior to surgery- Stop Anti-inflammatories such as Ibuprofen, Aleve, Advil, Motrin, meloxicam (MOBIC), diclofenac, etodolac, ketorolac, Toradol, Daypro, piroxicam, Goody's or BC powders. OK TO USE TYLENOL IF NEEDED   __X__ One week prior to surgery- Stop all supplements until after surgery.    ____ Bring C-Pap to the hospital.    If you have any questions regarding your pre-procedure instructions,  Please call Pre-admit Testing at [phone]

## 2021-06-19 ENCOUNTER — Encounter
Admission: RE | Admit: 2021-06-19 | Discharge: 2021-06-19 | Disposition: A | Discharge status: Home / Self Care | Payer: BC Managed Care – PPO | Source: Ambulatory Visit | Attending: Obstetrics and Gynecology | Admitting: Obstetrics and Gynecology

## 2021-06-19 DIAGNOSIS — N950 Postmenopausal bleeding: Secondary | ICD-10-CM | POA: Insufficient documentation

## 2021-06-19 DIAGNOSIS — I10 Essential (primary) hypertension: Secondary | ICD-10-CM

## 2021-06-19 DIAGNOSIS — Z01810 Encounter for preprocedural cardiovascular examination: Secondary | ICD-10-CM | POA: Diagnosis not present

## 2021-06-19 DIAGNOSIS — Z01818 Encounter for other preprocedural examination: Secondary | ICD-10-CM | POA: Insufficient documentation

## 2021-06-19 DIAGNOSIS — Z01812 Encounter for preprocedural laboratory examination: Secondary | ICD-10-CM

## 2021-06-19 LAB — TYPE AND SCREEN
ABO/RH(D): B POS
Antibody Screen: NEGATIVE

## 2021-06-19 LAB — BASIC METABOLIC PANEL
Anion gap: 5 (ref 5–15)
BUN: 20 mg/dL (ref 6–20)
CO2: 26 mmol/L (ref 22–32)
Calcium: 9.2 mg/dL (ref 8.9–10.3)
Chloride: 109 mmol/L (ref 98–111)
Creatinine, Ser: 0.81 mg/dL (ref 0.44–1.00)
GFR, Estimated: >60 mL/min (ref >60)
Glucose, Bld: 73 mg/dL (ref 70–99)
Potassium: 4 mmol/L (ref 3.5–5.1)
Sodium: 140 mmol/L (ref 135–145)

## 2021-06-19 LAB — CBC
HCT: 38.7 % (ref 36.0–46.0)
Hemoglobin: 12.6 g/dL (ref 12.0–15.0)
MCH: 28.4 pg (ref 26.0–34.0)
MCHC: 32.6 g/dL (ref 30.0–36.0)
MCV: 87.4 fL (ref 80.0–100.0)
Platelets: 331 10*3/uL (ref 150–400)
RBC: 4.43 MIL/uL (ref 3.87–5.11)
RDW: 12.5 % (ref 11.5–15.5)
WBC: 8.2 10*3/uL (ref 4.0–10.5)
nRBC: 0 % (ref 0.0–0.2)

## 2021-06-20 LAB — RPR: RPR Ser Ql: NONREACTIVE

## 2021-06-22 ENCOUNTER — Other Ambulatory Visit: Payer: BC Managed Care – PPO

## 2021-06-26 ENCOUNTER — Ambulatory Visit: Payer: BC Managed Care – PPO | Admitting: Urgent Care

## 2021-06-26 ENCOUNTER — Ambulatory Visit: Payer: BC Managed Care – PPO | Admitting: Certified Registered"

## 2021-06-26 ENCOUNTER — Other Ambulatory Visit: Payer: Self-pay

## 2021-06-26 ENCOUNTER — Ambulatory Visit
Admission: RE | Admit: 2021-06-26 | Discharge: 2021-06-26 | Disposition: A | Discharge status: Home / Self Care | Payer: BC Managed Care – PPO | Source: Ambulatory Visit | Attending: Obstetrics and Gynecology | Admitting: Obstetrics and Gynecology

## 2021-06-26 ENCOUNTER — Encounter: Payer: Self-pay | Admitting: Obstetrics and Gynecology

## 2021-06-26 ENCOUNTER — Encounter
Admission: RE | Disposition: A | Discharge status: Home / Self Care | Payer: Self-pay | Source: Ambulatory Visit | Attending: Obstetrics and Gynecology

## 2021-06-26 DIAGNOSIS — Q5128 Other doubling of uterus, other specified: Secondary | ICD-10-CM | POA: Insufficient documentation

## 2021-06-26 DIAGNOSIS — F419 Anxiety disorder, unspecified: Secondary | ICD-10-CM | POA: Insufficient documentation

## 2021-06-26 DIAGNOSIS — N841 Polyp of cervix uteri: Secondary | ICD-10-CM | POA: Diagnosis not present

## 2021-06-26 DIAGNOSIS — N950 Postmenopausal bleeding: Secondary | ICD-10-CM | POA: Diagnosis present

## 2021-06-26 DIAGNOSIS — I10 Essential (primary) hypertension: Secondary | ICD-10-CM | POA: Diagnosis not present

## 2021-06-26 DIAGNOSIS — Z79899 Other long term (current) drug therapy: Secondary | ICD-10-CM | POA: Insufficient documentation

## 2021-06-26 DIAGNOSIS — J45909 Unspecified asthma, uncomplicated: Secondary | ICD-10-CM | POA: Diagnosis not present

## 2021-06-26 DIAGNOSIS — Z01812 Encounter for preprocedural laboratory examination: Secondary | ICD-10-CM

## 2021-06-26 HISTORY — PX: HYSTEROSCOPY WITH D & C: SHX1775

## 2021-06-26 LAB — POCT PREGNANCY, URINE: Preg Test, Ur: NEGATIVE

## 2021-06-26 LAB — ABO/RH: ABO/RH(D): B POS

## 2021-06-26 SURGERY — DILATATION AND CURETTAGE /HYSTEROSCOPY
Anesthesia: General

## 2021-06-26 MED ORDER — FENTANYL CITRATE (PF) 100 MCG/2ML IJ SOLN
INTRAMUSCULAR | Status: DC | PRN
  Administered 2021-06-26: 50 ug via INTRAVENOUS

## 2021-06-26 MED ORDER — EPHEDRINE 5 MG/ML INJ
INTRAVENOUS | Status: AC
Start: 2021-06-26 — End: ?
  Filled 2021-06-26: qty 5

## 2021-06-26 MED ORDER — ONDANSETRON HCL 4 MG/2ML IJ SOLN: 4.0000 mg | Freq: Once | INTRAMUSCULAR | Status: DC | PRN

## 2021-06-26 MED ORDER — CHLORHEXIDINE GLUCONATE 0.12 % MT SOLN
OROMUCOSAL | Status: AC
  Administered 2021-06-26: 15 mL via OROMUCOSAL
  Filled 2021-06-26: qty 15

## 2021-06-26 MED ORDER — CHLORHEXIDINE GLUCONATE 0.12 % MT SOLN: 15.0000 mL | Freq: Once | OROMUCOSAL | Status: AC

## 2021-06-26 MED ORDER — FENTANYL CITRATE (PF) 100 MCG/2ML IJ SOLN: 25.0000 ug | INTRAMUSCULAR | Status: DC | PRN

## 2021-06-26 MED ORDER — MIDAZOLAM HCL 2 MG/2ML IJ SOLN
INTRAMUSCULAR | Status: DC | PRN
  Administered 2021-06-26: 2 mg via INTRAVENOUS

## 2021-06-26 MED ORDER — LACTATED RINGERS IV SOLN: INTRAVENOUS | Status: DC

## 2021-06-26 MED ORDER — DEXAMETHASONE SODIUM PHOSPHATE 10 MG/ML IJ SOLN
INTRAMUSCULAR | Status: AC
Start: 2021-06-26 — End: ?
  Filled 2021-06-26: qty 1

## 2021-06-26 MED ORDER — ORAL CARE MOUTH RINSE: 15.0000 mL | Freq: Once | OROMUCOSAL | Status: AC

## 2021-06-26 MED ORDER — DEXAMETHASONE SODIUM PHOSPHATE 10 MG/ML IJ SOLN
INTRAMUSCULAR | Status: DC | PRN
  Administered 2021-06-26: 6 mg via INTRAVENOUS

## 2021-06-26 MED ORDER — OXYCODONE HCL 5 MG PO TABS
ORAL_TABLET | ORAL | Status: AC
  Administered 2021-06-26: 5 mg via ORAL
  Filled 2021-06-26: qty 1

## 2021-06-26 MED ORDER — PROPOFOL 10 MG/ML IV BOLUS
INTRAVENOUS | Status: DC | PRN
  Administered 2021-06-26: 40 mg via INTRAVENOUS
  Administered 2021-06-26: 160 mg via INTRAVENOUS
  Administered 2021-06-26: 50 mg via INTRAVENOUS

## 2021-06-26 MED ORDER — DIPHENHYDRAMINE HCL 25 MG PO CAPS
ORAL_CAPSULE | ORAL | Status: AC
  Administered 2021-06-26: 50 mg
  Filled 2021-06-26: qty 2

## 2021-06-26 MED ORDER — ONDANSETRON HCL 4 MG/2ML IJ SOLN
INTRAMUSCULAR | Status: AC
Start: 2021-06-26 — End: ?
  Filled 2021-06-26: qty 2

## 2021-06-26 MED ORDER — ONDANSETRON HCL 4 MG/2ML IJ SOLN
INTRAMUSCULAR | Status: DC | PRN
  Administered 2021-06-26: 4 mg via INTRAVENOUS

## 2021-06-26 MED ORDER — OXYCODONE HCL 5 MG PO TABS: 5.0000 mg | ORAL_TABLET | Freq: Once | ORAL | Status: AC

## 2021-06-26 MED ORDER — MIDAZOLAM HCL 2 MG/2ML IJ SOLN
INTRAMUSCULAR | Status: AC
  Filled 2021-06-26: qty 2

## 2021-06-26 MED ORDER — PROPOFOL 1000 MG/100ML IV EMUL
INTRAVENOUS | Status: AC
Start: 2021-06-26 — End: ?
  Filled 2021-06-26: qty 100

## 2021-06-26 MED ORDER — FENTANYL CITRATE (PF) 100 MCG/2ML IJ SOLN
INTRAMUSCULAR | Status: AC
  Filled 2021-06-26: qty 2

## 2021-06-26 MED ORDER — KETOROLAC TROMETHAMINE 30 MG/ML IJ SOLN
INTRAMUSCULAR | Status: AC
  Filled 2021-06-26: qty 1

## 2021-06-26 MED ORDER — EPHEDRINE SULFATE (PRESSORS) 50 MG/ML IJ SOLN
INTRAMUSCULAR | Status: DC | PRN
  Administered 2021-06-26 (×2): 10 mg via INTRAVENOUS

## 2021-06-26 MED ORDER — DIPHENHYDRAMINE HCL 25 MG PO CAPS: 50.0000 mg | ORAL_CAPSULE | Freq: Once | ORAL | Status: DC

## 2021-06-26 MED ORDER — LIDOCAINE HCL (PF) 2 % IJ SOLN
INTRAMUSCULAR | Status: AC
  Filled 2021-06-26: qty 5

## 2021-06-26 MED ORDER — PHENYLEPHRINE HCL (PRESSORS) 10 MG/ML IV SOLN
INTRAVENOUS | Status: DC | PRN
  Administered 2021-06-26 (×2): 80 ug via INTRAVENOUS

## 2021-06-26 MED ORDER — POVIDONE-IODINE 10 % EX SWAB
2.0000 | Freq: Once | CUTANEOUS | Status: AC
  Administered 2021-06-26: 2 via TOPICAL

## 2021-06-26 MED ORDER — SODIUM CHLORIDE 0.9 % IR SOLN
Status: DC | PRN
  Administered 2021-06-26: 3000 mL

## 2021-06-26 MED ORDER — KETOROLAC TROMETHAMINE 30 MG/ML IJ SOLN
INTRAMUSCULAR | Status: DC | PRN
  Administered 2021-06-26: 15 mg via INTRAVENOUS

## 2021-06-26 MED ORDER — LIDOCAINE HCL (CARDIAC) PF 100 MG/5ML IV SOSY
PREFILLED_SYRINGE | INTRAVENOUS | Status: DC | PRN
  Administered 2021-06-26: 80 mg via INTRAVENOUS

## 2021-06-26 SURGICAL SUPPLY — 22 items
BACTOSHIELD CHG 4% 4OZ (MISCELLANEOUS) ×1
BAG PRESSURE INF REUSE 1000 (BAG) ×1 IMPLANT
DEVICE MYOSURE LITE (MISCELLANEOUS) ×1 IMPLANT
DRSG TELFA 3X8 NADH (GAUZE/BANDAGES/DRESSINGS) IMPLANT
ELECT REM PT RETURN 9FT ADLT (ELECTROSURGICAL) ×2
ELECTRODE REM PT RTRN 9FT ADLT (ELECTROSURGICAL) ×1 IMPLANT
GLOVE BIO SURGEON STRL SZ7 (GLOVE) ×2 IMPLANT
GLOVE SURG UNDER LTX SZ7.5 (GLOVE) ×2 IMPLANT
GOWN STRL REUS W/ TWL LRG LVL3 (GOWN DISPOSABLE) ×2 IMPLANT
GOWN STRL REUS W/TWL LRG LVL3 (GOWN DISPOSABLE) ×4
IV NS IRRIG 3000ML ARTHROMATIC (IV SOLUTION) ×2 IMPLANT
KIT PROCEDURE FLUENT (KITS) ×2 IMPLANT
KIT TURNOVER CYSTO (KITS) ×2 IMPLANT
MANIFOLD NEPTUNE II (INSTRUMENTS) ×2 IMPLANT
PACK DNC HYST (MISCELLANEOUS) ×2 IMPLANT
PAD DRESSING TELFA 3X8 NADH (GAUZE/BANDAGES/DRESSINGS) IMPLANT
PAD PREP 24X41 OB/GYN DISP (PERSONAL CARE ITEMS) ×2 IMPLANT
SCRUB CHG 4% DYNA-HEX 4OZ (MISCELLANEOUS) ×1 IMPLANT
SEAL ROD LENS SCOPE MYOSURE (ABLATOR) ×2 IMPLANT
SET CYSTO W/LG BORE CLAMP LF (SET/KITS/TRAYS/PACK) IMPLANT
TUBING CONNECTING 10 (TUBING) ×2 IMPLANT
WATER STERILE IRR 500ML POUR (IV SOLUTION) ×2 IMPLANT

## 2021-06-26 NOTE — Op Note (Signed)
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 Exam under anesthesia 2. Fractional D&C 3. Hysteroscopy 4. Endocervical/endometrial polypectomy  Surgeon: Christeen Douglas, MD  Assistant(s):  None  Anesthesia: General LMA anesthesia  Anesthesiologist: Darleene Cleaver, Gerrit Heck, MD Anesthesiologist: Darleene Cleaver, Gerrit Heck, MD CRNA: Gilford Raid, CRNA  Estimated Blood Loss:   20         Intraoperative medications:  toradol         Total IV Fluids:  Urine Output: 63ml  Total Fluid Deficit:  300 mL          Specimens: Endocervical curettings, endometrial curettings, endocervical polyp         Complications:  None; patient tolerated the procedure well.         Disposition: PACU - hemodynamically stable.         Condition: stable  Findings: Uterus measuring 7 cm by sound; normal cervix, vagina, perineum. Endocervical polyp at junction of endometrial canal, but protruding all the way to the external os. 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  Indication for procedure/Consents: 51 y.o. G1P1 here for scheduled surgery for the aforementioned diagnoses.  Endocervical mass visualized on ultrasound for postmenopausal bleeding.   Risks of surgery were discussed with the patient including but not limited to: bleeding which may require transfusion; infection which may require antibiotics; injury to uterus or surrounding organs; intrauterine scarring which may impair future fertility; need for additional procedures including laparotomy or laparoscopy; and other postoperative/anesthesia complications. Written informed consent was obtained.    Procedure Details:   D&C/ Myosure  The patient was taken to the operating room where anesthesia was  administered and was found to be adequate.  After a formal and adequate timeout was performed, she was placed in the dorsal lithotomy position and examined with the above findings. She was then prepped and draped in the sterile manner.   Her bladder was catheterized for an estimated amount of clear, yellow urine. A weighed speculum was then placed in the patient's vagina and a single tooth tenaculum was applied to the anterior lip of the cervix.  Her cervix was serially dilated to 15 [place] using Hanks dilators.  An ECC was performed. The hysteroscope was introduced under direct observation  Using lactated ringers as a distention medium to reveal the above findings. The uterine cavity was carefully examined, both ostia were recognized, and diffusely mildly proliferative endometrium. Large endocervical polyp visualized occupying the entire endocervical canal.  This was resected using the Myosure device.  After further careful visualization of the uterine cavity, the hysteroscope was removed under direct visualization.  A sharp curettage was then performed until there was a gritty texture in all four quadrants.  The tenaculum was removed from the anterior lip of the cervix and the vaginal speculum was removed after applying pressure for good hemostasis.   The patient tolerated the procedure well and was taken to the recovery area awake and in stable condition. She received iv acetaminophen and Toradol prior to leaving the OR.  The patient will be discharged to home as per PACU criteria. Routine postoperative instructions given.  She was prescribed Ibuprofen and Colace.  She will follow up in the clinic in two weeks for postoperative evaluation.

## 2021-06-26 NOTE — Discharge Instructions (Addendum)
Discharge instructions after a hysteroscopy with dilation and curettage  Signs and Symptoms to Report  Call our office at [phone] if you have any of the following:    Fever over 100.4 degrees or higher  Severe stomach pain not relieved with pain medications  Bright red bleeding that's heavier than a period that does not slow with rest after the first 24 hours  To go the bathroom a lot (frequency), you can't hold your urine (urgency), or it hurts when you empty your bladder (urinate)  Chest pain  Shortness of breath  Pain in the calves of your legs  Severe nausea and vomiting not relieved with anti-nausea medications  Any concerns  What You Can Expect after Surgery  You may see some pink tinged, bloody fluid. This is normal. You may also have cramping for several days.   Activities after Your Discharge Follow these guidelines to help speed your recovery at home:  Don't drive if you are in pain or taking narcotic pain medicine. You may drive when you can safely slam on the brakes, turn the wheel forcefully, and rotate your torso comfortably. This is typically 4-7 days. Practice in a parking lot or side street prior to attempting to drive regularly.   Ask others to help with household chores for 4 weeks.  Don't do strenuous activities, exercises, or sports like vacuuming, tennis, squash, etc. until your doctor says it is safe to do so.  Walk as you feel able. Rest often since it may take a week or two for your energy level to return to normal.   You may climb stairs  Avoid constipation:   -Eat fruits, vegetables, and whole grains. Eat small meals as your appetite will take time to return to normal.   -Drink 6 to 8 glasses of water each day unless your doctor has told you to limit your fluids.   -Use a laxative or stool softener as needed if constipation becomes a problem. You may take Miralax, metamucil, Citrucil, Colace, [place]ot, FiberCon, etc. If this does not relieve the  constipation, try two tablespoons of Milk Of Magnesia every 8 hours until your bowels move.   You may shower.   Do not get in a hot tub, swimming pool, etc. until your doctor agrees.  Do not douche, use tampons, or have sex until your doctor says it is okay, usually about 2 weeks.  Take your pain medicine when you need it. The medicine may not work as well if the pain is bad.  Take the medicines you were taking before surgery. Other medications you might need are pain medications (ibuprofen), medications for constipation (Colace) and nausea medications (Zofran).      AMBULATORY SURGERY  DISCHARGE INSTRUCTIONS   The drugs that you were given will stay in your system until tomorrow so for the next 24 hours you should not:  Drive an automobile Make any legal decisions Drink any alcoholic beverage   You may resume regular meals tomorrow.  Today it is better to start with liquids and gradually work up to solid foods.  You may eat anything you prefer, but it is better to start with liquids, then soup and crackers, and gradually work up to solid foods.   Please notify your doctor immediately if you have any unusual bleeding, trouble breathing, redness and pain at the surgery site, drainage, fever, or pain not relieved by medication.    Additional Instructions:        Please contact your physician   with any problems or Same Day Surgery at [phone], Monday through Friday 6 am to 4 pm, or [place] [facility] [place] Main number at [phone]. 

## 2021-06-26 NOTE — Anesthesia Procedure Notes (Signed)
Procedure Name: LMA Insertion Date/Time: 06/26/2021 9:50 AM  Performed by: Javeah Loeza, CRNAPre-anesthesia Checklist: Patient identified, Patient being monitored, Timeout performed, Emergency Drugs available and Suction available Patient Re-evaluated:Patient Re-evaluated prior to induction Oxygen Delivery Method: Circle system utilized Preoxygenation: Pre-oxygenation with 100% oxygen Induction Type: IV induction Ventilation: Mask ventilation without difficulty LMA: LMA inserted LMA Size: 3.5 Tube type: Oral Number of attempts: 1 Placement Confirmation: positive ETCO2 and breath sounds checked- equal and bilateral Tube secured with: Tape Dental Injury: Teeth and Oropharynx as per pre-operative assessment

## 2021-06-26 NOTE — Anesthesia Preprocedure Evaluation (Signed)
Anesthesia Evaluation  Patient identified by MRN, date of birth, ID band Patient awake    Reviewed: Allergy & Precautions, NPO status , Patient's Chart, lab work & pertinent test results  Airway Mallampati: II  TM Distance: >3 FB Neck ROM: Full    Dental  (+) Teeth Intact, Caps,    Pulmonary neg pulmonary ROS, asthma ,    Pulmonary exam normal breath sounds clear to auscultation       Cardiovascular Exercise Tolerance: Good hypertension, Pt. on medications negative cardio ROS Normal cardiovascular exam Rhythm:Regular     Neuro/Psych Anxiety negative neurological ROS  negative psych ROS   GI/Hepatic negative GI ROS, Neg liver ROS,   Endo/Other  negative endocrine ROS  Renal/GU negative Renal ROS     Musculoskeletal negative musculoskeletal ROS (+)   Abdominal Normal abdominal exam  (+)   Peds negative pediatric ROS (+)  Hematology negative hematology ROS (+)   Anesthesia Other Findings Past Medical History: No date: Anxiety No date: Asthma No date: Hypertension No date: Pre-diabetes  Past Surgical History: 12/2020: colonoscopy No date: NO PAST SURGERIES  BMI    Body Mass Index: 27.83 kg/m      Reproductive/Obstetrics negative OB ROS                             Anesthesia Physical Anesthesia Plan  ASA: 2  Anesthesia Plan: General   Post-op Pain Management:    Induction: Intravenous  PONV Risk Score and Plan: 1 and Ondansetron and Dexamethasone  Airway Management Planned: LMA and Oral ETT  Additional Equipment:   Intra-op Plan:   Post-operative Plan: Extubation in OR  Informed Consent: I have reviewed the patients History and Physical, chart, labs and discussed the procedure including the risks, benefits and alternatives for the proposed anesthesia with the patient or authorized representative who has indicated his/her understanding and acceptance.     Dental  advisory given  Plan Discussed with: CRNA and Surgeon  Anesthesia Plan Comments:         Anesthesia Quick Evaluation

## 2021-06-26 NOTE — Interval H&P Note (Signed)
History and Physical Interval Note:  06/26/2021 9:36 AM  Ana Atkins  has presented today for surgery, with the diagnosis of post menopausal bleeding, endocervical mass.  The various methods of treatment have been discussed with the patient and family. After consideration of risks, benefits and other options for treatment, the patient has consented to  Procedure(s): DILATATION AND CURETTAGE /HYSTEROSCOPY, POLYPECTOMY, POSSIBLE MYOSURE & ENDOLOOP (N/A) as a surgical intervention.  The patient's history has been reviewed, patient examined, no change in status, stable for surgery.  I have reviewed the patient's chart and labs.  Questions were answered to the patient's satisfaction.     Christeen Douglas

## 2021-06-26 NOTE — Anesthesia Postprocedure Evaluation (Signed)
Anesthesia Post Note  Patient: Ana Atkins  Procedure(s) Performed: DILATATION AND CURETTAGE [name] POLYPECTOMY, MYOSURE  Patient location during evaluation: PACU Anesthesia Type: General Level of consciousness: awake and oriented Pain management: pain level controlled Vital Signs Assessment: post-procedure vital signs reviewed and stable Respiratory status: spontaneous breathing and respiratory function stable Cardiovascular status: stable Anesthetic complications: no   No notable events documented.   Last Vitals:  Vitals:   06/26/21 1035 06/26/21 1045  BP: 133/88 (!) 126/91  Pulse: 77 75  Resp: 18 11  Temp: 36.8 C   SpO2: 100% 100%    Last Pain:  Vitals:   06/26/21 1045  TempSrc:   PainSc: 0-No pain                 VAN STAVEREN,Sheree Lalla

## 2021-06-26 NOTE — Transfer of Care (Signed)
Immediate Anesthesia Transfer of Care Note  Patient: Ana Atkins  Procedure(s) Performed: DILATATION AND CURETTAGE [name] POLYPECTOMY, MYOSURE  Patient Location: PACU  Anesthesia Type:General  Level of Consciousness: awake, alert  and oriented  Airway & Oxygen Therapy: Patient Spontanous Breathing  Post-op Assessment: Report given to RN and Post -op Vital signs reviewed and stable  Post vital signs: Reviewed and stable  Last Vitals:  Vitals Value Taken Time  BP 133/88 06/26/21 1035  Temp 36.8 C 06/26/21 1035  Pulse 70 06/26/21 1039  Resp 12 06/26/21 1039  SpO2 95 % 06/26/21 1039  Vitals shown include unvalidated device data.  Last Pain:  Vitals:   06/26/21 1035  TempSrc:   PainSc: Asleep      Patients Stated Pain Goal: 0 (06/26/21 0739)  Complications: No notable events documented.

## 2021-06-27 ENCOUNTER — Encounter: Payer: Self-pay | Admitting: Obstetrics and Gynecology

## 2021-06-30 LAB — SURGICAL PATHOLOGY

## 2022-10-19 IMAGING — MG MM DIGITAL SCREENING BILAT W/ TOMO AND CAD
8 series · 8 of 24 positions shown · non-contrast
Comparison: Previous exam(s).

CLINICAL DATA: Screening.

EXAM:
DIGITAL SCREENING BILATERAL MAMMOGRAM WITH TOMOSYNTHESIS AND CAD
TECHNIQUE: Bilateral screening digital craniocaudal and mediolateral oblique
mammograms were obtained. Bilateral screening digital breast
tomosynthesis was performed. The images were evaluated with
computer-aided detection.

[R MLO synth-2D]
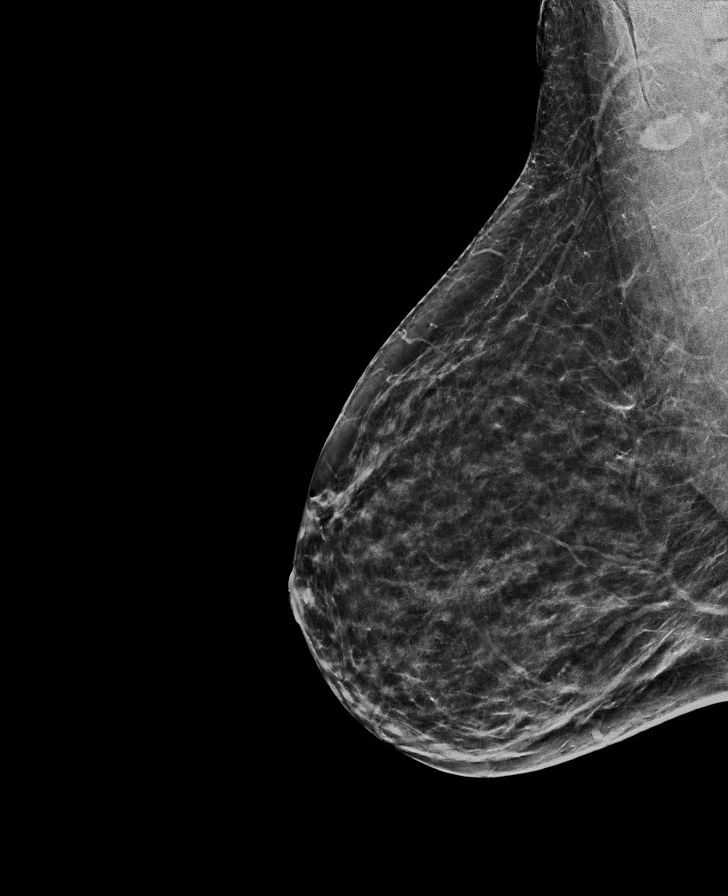

[R CC synth-2D]
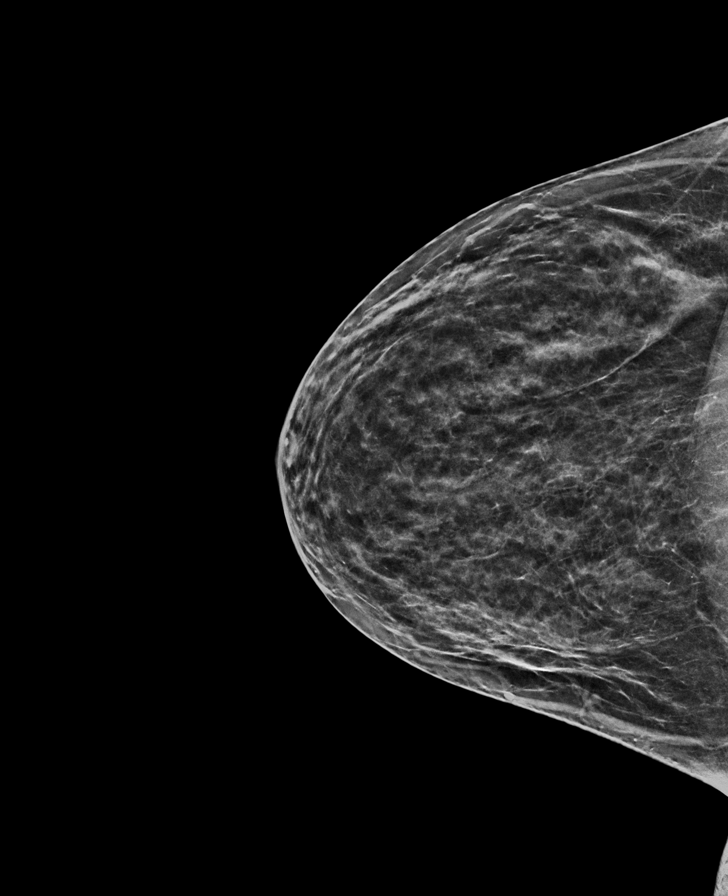

[L CC synth-2D]
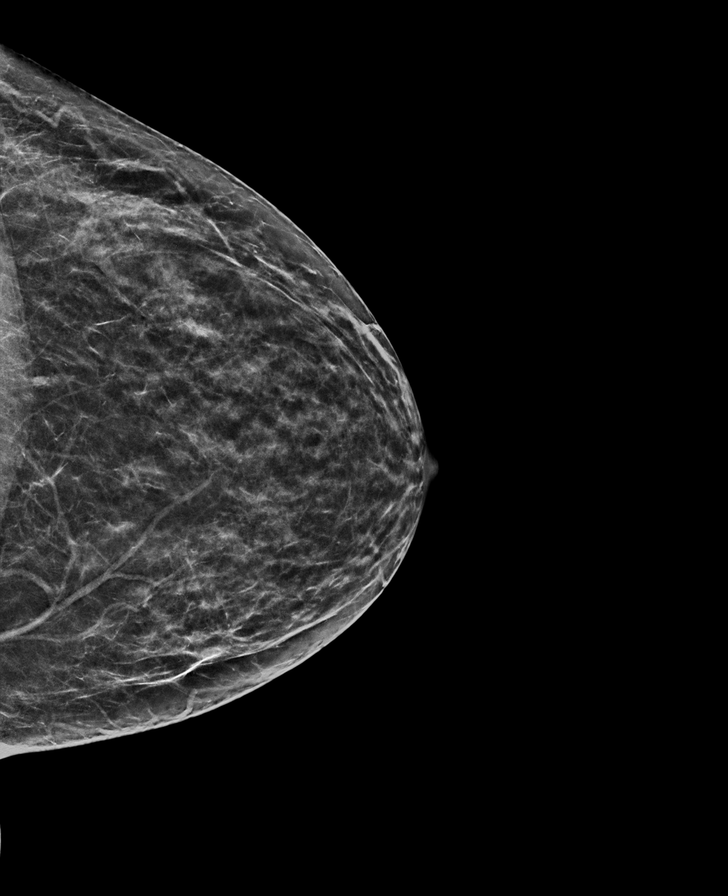

[L MLO synth-2D]
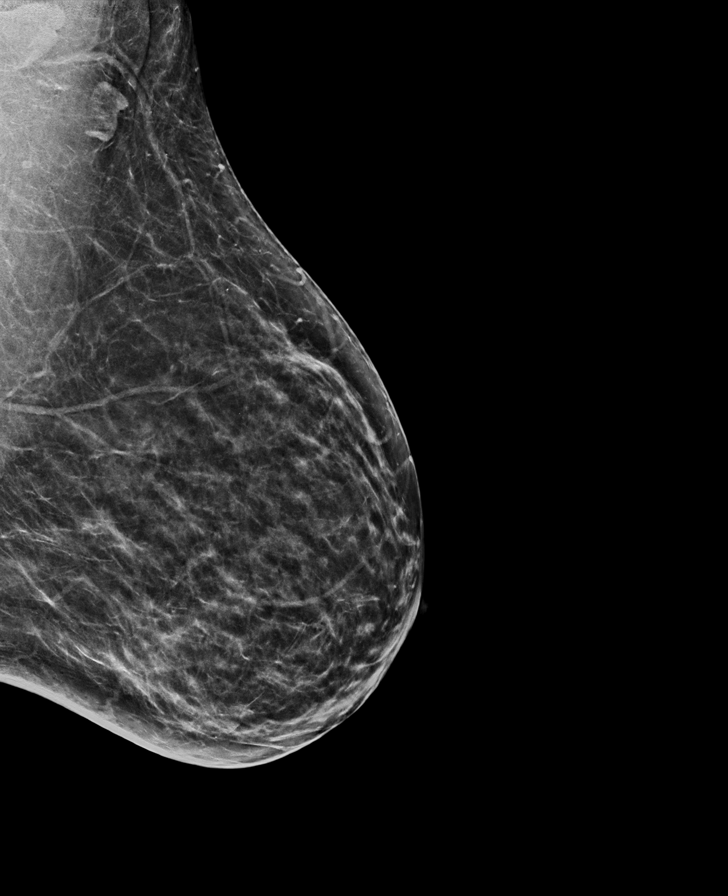

[L MLO tomo · tomo slice 33/66.0]
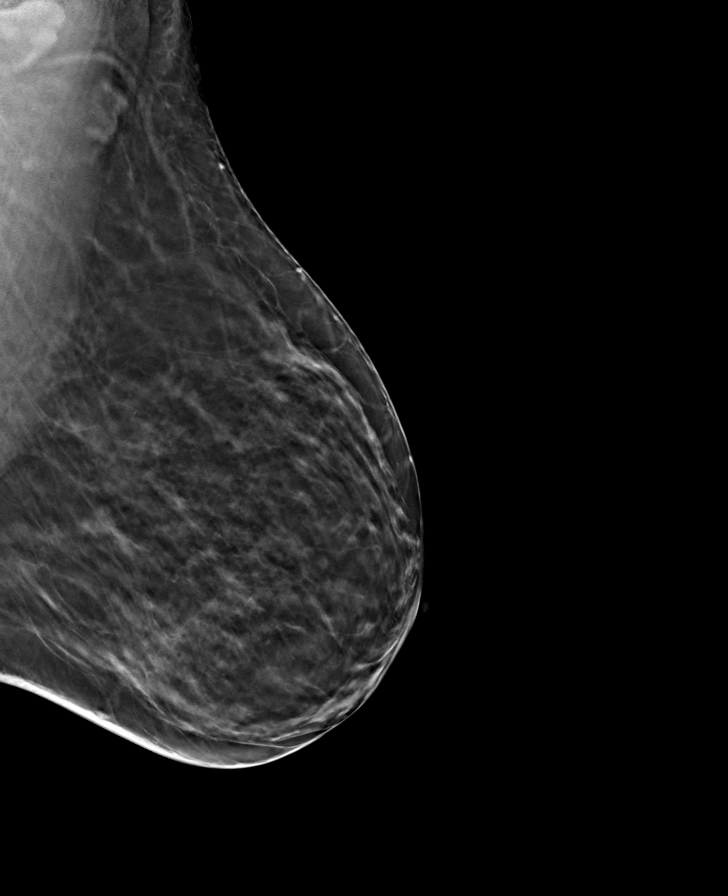

[L CC tomo · tomo slice 31/61.0]
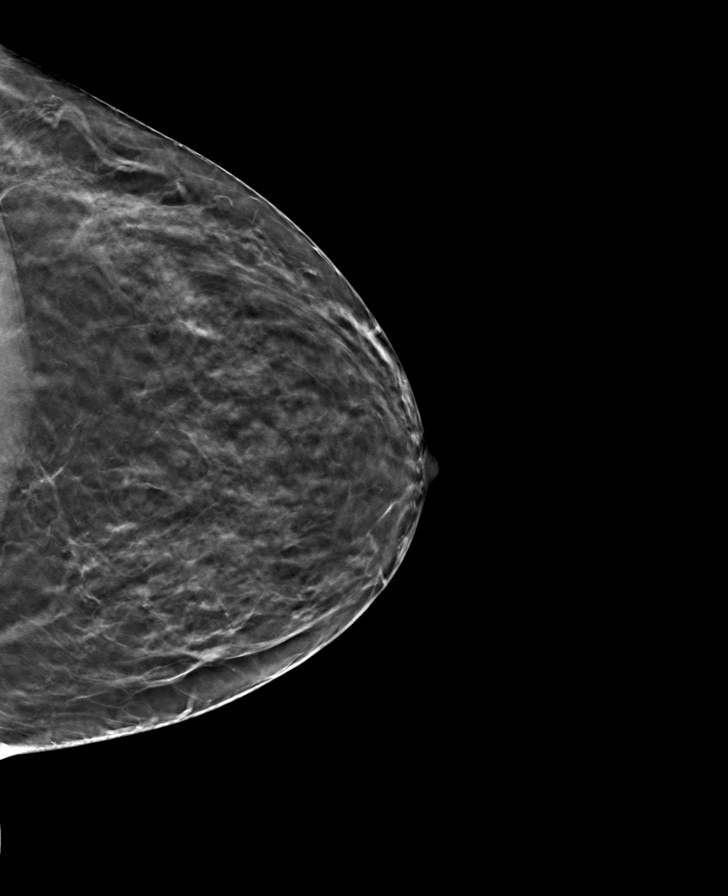

[R CC tomo · tomo slice 29/58.0]
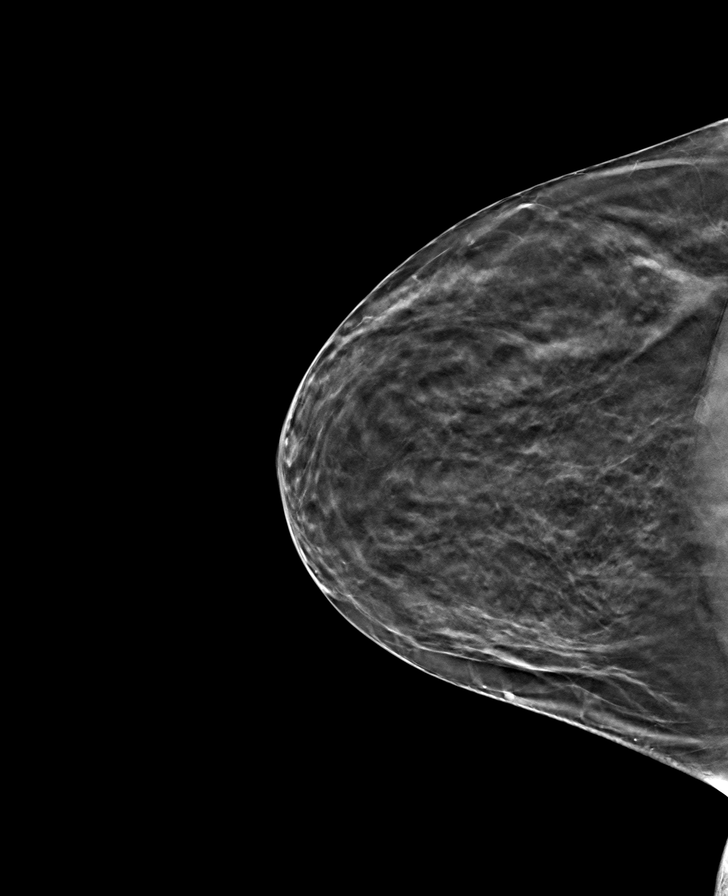

[R MLO tomo · tomo slice 33/65.0]
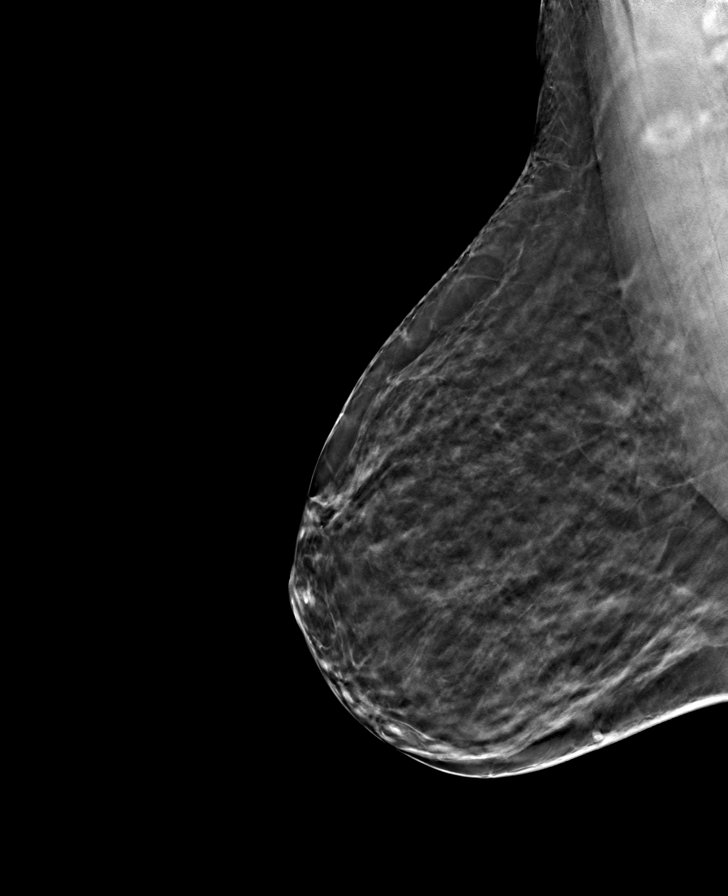

[8 of 24 positions shown; findings below may reference images not displayed]

ACR Breast Density Category b: There are scattered areas of
fibroglandular density.
FINDINGS: There are no findings suspicious for malignancy.
IMPRESSION: No mammographic evidence of malignancy. A result letter of this
screening mammogram will be mailed directly to the patient.

RECOMMENDATION:
Screening mammogram in one year. (Code:51-O-LD2)

BI-RADS CATEGORY  1: Negative.

## 2023-04-15 ENCOUNTER — Other Ambulatory Visit: Payer: Self-pay | Admitting: Internal Medicine

## 2023-04-15 DIAGNOSIS — Z1231 Encounter for screening mammogram for malignant neoplasm of breast: Secondary | ICD-10-CM
